# Patient Record
Sex: Female | Born: 1955
Health system: Southern US, Community
[De-identification: ages and names within clinical notes are randomized; demographics above are authoritative.]

## PROBLEM LIST (undated history)

## (undated) DIAGNOSIS — I10 Essential (primary) hypertension: Secondary | ICD-10-CM

## (undated) HISTORY — PX: TUBAL LIGATION: SHX77

## (undated) HISTORY — DX: Essential (primary) hypertension: I10

---

## 1999-04-29 ENCOUNTER — Other Ambulatory Visit: Admission: RE | Admit: 1999-04-29 | Discharge: 1999-04-29 | Payer: Self-pay | Admitting: *Deleted

## 2000-06-09 ENCOUNTER — Other Ambulatory Visit: Admission: RE | Admit: 2000-06-09 | Discharge: 2000-06-09 | Payer: Self-pay | Admitting: Obstetrics and Gynecology

## 2000-10-01 ENCOUNTER — Encounter: Payer: Self-pay | Admitting: Cardiology

## 2000-10-01 ENCOUNTER — Encounter: Admission: RE | Admit: 2000-10-01 | Discharge: 2000-10-01 | Payer: Self-pay | Admitting: Cardiology

## 2001-06-17 ENCOUNTER — Other Ambulatory Visit: Admission: RE | Admit: 2001-06-17 | Discharge: 2001-06-17 | Payer: Self-pay | Admitting: Obstetrics and Gynecology

## 2003-03-06 ENCOUNTER — Other Ambulatory Visit: Admission: RE | Admit: 2003-03-06 | Discharge: 2003-03-06 | Payer: Self-pay | Admitting: Obstetrics & Gynecology

## 2003-07-03 ENCOUNTER — Encounter: Payer: Self-pay | Admitting: Cardiology

## 2003-07-03 ENCOUNTER — Encounter: Admission: RE | Admit: 2003-07-03 | Discharge: 2003-07-03 | Payer: Self-pay | Admitting: Cardiology

## 2004-08-25 ENCOUNTER — Ambulatory Visit (HOSPITAL_COMMUNITY): Admission: RE | Admit: 2004-08-25 | Discharge: 2004-08-25 | Payer: Self-pay | Admitting: Cardiology

## 2005-10-15 ENCOUNTER — Encounter: Admission: RE | Admit: 2005-10-15 | Discharge: 2005-10-15 | Payer: Self-pay | Admitting: Cardiology

## 2008-02-27 ENCOUNTER — Emergency Department (HOSPITAL_COMMUNITY): Admission: EM | Admit: 2008-02-27 | Discharge: 2008-02-27 | Payer: Self-pay | Admitting: Emergency Medicine

## 2009-01-08 ENCOUNTER — Ambulatory Visit: Payer: Self-pay | Admitting: Gastroenterology

## 2009-09-18 ENCOUNTER — Emergency Department (HOSPITAL_COMMUNITY): Admission: EM | Admit: 2009-09-18 | Discharge: 2009-09-18 | Payer: Self-pay | Admitting: Family Medicine

## 2010-11-03 ENCOUNTER — Emergency Department (HOSPITAL_COMMUNITY): Admission: EM | Admit: 2010-11-03 | Discharge: 2010-11-03 | Payer: Self-pay | Admitting: Emergency Medicine

## 2012-05-11 ENCOUNTER — Encounter: Payer: Managed Care, Other (non HMO) | Attending: Family Medicine | Admitting: *Deleted

## 2012-05-11 ENCOUNTER — Encounter: Payer: Self-pay | Admitting: *Deleted

## 2012-05-11 DIAGNOSIS — E669 Obesity, unspecified: Secondary | ICD-10-CM | POA: Insufficient documentation

## 2012-05-11 DIAGNOSIS — Z713 Dietary counseling and surveillance: Secondary | ICD-10-CM | POA: Insufficient documentation

## 2012-05-11 DIAGNOSIS — I10 Essential (primary) hypertension: Secondary | ICD-10-CM | POA: Insufficient documentation

## 2012-05-11 NOTE — Progress Notes (Signed)
  Medical Nutrition Therapy:  Appt start time: 0845 end time:  0945.  Assessment:  Primary concerns today: patient here for assistance with weight loss and HTN. She states she is having trouble with menopause with breaking out in a sweat every afternoon and during the night. She states she has gained 30 pounds in the past 2 years. Recently she has lost 5 pounds in past 2 months after cutting out snacks and sweets. She is fairly active at work as she walks on campus daily for a mile or more. 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 She lives alone, enjoys quilting and prepares most of her own meals   MEDICATIONS: see list   DIETARY INTAKE:  Usual eating pattern includes 3 meals and 0 snacks per day.  Everyday foods include good variety of all food groups except dairy products.  Avoided foods include sodas now, ice cream due to lactose intolerance .    24-hr recall:  B ( AM): coffee and toast with oleo x 4 days a week, OR skip  Snk ( AM): none  L ( PM): cafe at work- salad and sandwich, crystal light Snk ( PM): none D ( PM): cook low fat meals, left overs, water Snk ( PM): none Beverages: coffee, flavored water, OJ occasionally  Usual physical activity: walks at work on campus,  Estimated energy needs: 1200 calories 135 g carbohydrates 90 g protein 33 g fat  Progress Towards Goal(s):  In progress.   Nutritional Diagnosis:  NI-1.5 Excessive energy intake As related to current activity level.  As evidenced by BMI of 31.1 .    Intervention:  Nutrition counseling provided for weight loss and HTN. Discussed Carb Counting as method for managing calorie intake and Sodium restrictions for HTN. Also recommended increased activity level to assist with both weight loss, HTN and symptoms of menopause Plan: Aim for 2-3 Carb Choices per meal (30-45 grams) Read food labels for total carb and sodium content of foods Continue with walking  at work Consider other forms of exercise ( armchair, pool) Consider a planned time that can be consistent on a daily basis  Handouts given during visit include: Carb Counting and Food Label handouts Meal Plan Card  Arm Chair Exercise book suggestion list  Low Sodium handout  Monitoring/Evaluation:  Dietary intake, exercise, reading food labels, and body weight prn.

## 2012-05-11 NOTE — Patient Instructions (Addendum)
Plan: Aim for 2-3 Carb Choices per meal (30-45 grams) Read food labels for total carb and sodium content of foods Continue with walking at work Consider other forms of exercise ( armchair, pool) Consider a planned time that can be consistent on a daily basis Reference for calories and carbs is Calorie Brooke Dare (book or APP)

## 2014-04-26 ENCOUNTER — Ambulatory Visit: Payer: BC Managed Care – PPO

## 2014-04-26 ENCOUNTER — Ambulatory Visit (INDEPENDENT_AMBULATORY_CARE_PROVIDER_SITE_OTHER): Payer: BC Managed Care – PPO | Admitting: Emergency Medicine

## 2014-04-26 VITALS — BP 110/64 | HR 65 | Temp 98.4°F | Resp 16 | Ht 67.0 in | Wt 196.0 lb

## 2014-04-26 DIAGNOSIS — M25569 Pain in unspecified knee: Secondary | ICD-10-CM

## 2014-04-26 MED ORDER — ACETAMINOPHEN-CODEINE #3 300-30 MG PO TABS: 1.0000 | ORAL_TABLET | ORAL | Status: DC | PRN

## 2014-04-26 NOTE — Patient Instructions (Signed)
Knee Pain Knee pain can be a result of an injury or other medical conditions. Treatment will depend on the cause of your pain. HOME CARE  Only take medicine as told by your doctor.  Keep a healthy weight. Being overweight can make the knee hurt more.  Stretch before exercising or playing sports.  If there is constant knee pain, change the way you exercise. Ask your doctor for advice.  Make sure shoes fit well. Choose the right shoe for the sport or activity.  Protect your knees. Wear kneepads if needed.  Rest when you are tired. GET HELP RIGHT AWAY IF:   Your knee pain does not stop.  Your knee pain does not get better.  Your knee joint feels hot to the touch.  You have a fever. MAKE SURE YOU:   Understand these instructions.  Will watch this condition.  Will get help right away if you are not doing well or get worse. Document Released: 02/12/2009 Document Revised: 02/08/2012 Document Reviewed: 02/12/2009 Exit[employer] Patient Information 2014 [employer].  

## 2014-04-26 NOTE — Progress Notes (Signed)
[facility] [address] [place] [address] [phone]  Date:  04/26/2014   Name:  Erica Trujillo   DOB:  1956/03/01   MRN:  [id number]  PCP:  Evlyn Courier, MD    Chief Complaint: Fall   History of Present Illness:  Erica Trujillo is a 58 y.o. very pleasant female patient who presents with the following:  [place] Sunday while chasing a puppy.  She tripped and fell, landing on her face and right knee.  Since she has some pain in the knee but marked ecchymosis in thigh to the groin and some in the posterior calf.  Minimal pain with ambulation but no pain in hip or pelvis.  Denies other complaint of injury.  Taking 4 alleve BID with no relief.    There are no active problems to display for this patient.   Past Medical History  Diagnosis Date  . Hypertension     No past surgical history on file.  History  Substance Use Topics  . Smoking status: Current Every Day Smoker -- 0.20 packs/day for 18 years  . Smokeless tobacco: Never Used  . Alcohol Use: Yes     Comment: 1 glass wine at night    No family history on file.  Allergies  Allergen Reactions  . Penicillins     Medication list has been reviewed and updated.  Current Outpatient Prescriptions on File Prior to Visit  Medication Sig Dispense Refill  . aspirin 81 MG tablet Take 81 mg by mouth daily.      . nebivolol (BYSTOLIC) 10 MG tablet Take 40 mg by mouth daily.      . Olmesartan-Amlodipine-HCTZ (TRIBENZOR) 40-10-25 MG TABS Take by mouth.       No current facility-administered medications on file prior to visit.    Review of Systems:  As per HPI, otherwise negative.    Physical Examination: Filed Vitals:   04/26/14 1823  BP: 110/64  Pulse: 65  Temp: 98.4 F (36.9 C)  Resp: 16   Filed Vitals:   04/26/14 1823  Height: 5\' 7"  (1.702 m)  Weight: 196 lb (88.905 kg)   Body mass index is 30.69 kg/(m^2). Ideal Body Weight: Weight in (lb) to have BMI = 25: 159.3   GEN:  WDWN, NAD, Non-toxic, Alert & Oriented x 3 HEENT: Atraumatic, Normocephalic.  Ears and Nose: No external deformity. EXTR: No clubbing/cyanosis/edema NEURO: Normal gait.  PSYCH: Normally interactive. Conversant. Not depressed or anxious appearing.  Calm demeanor.  Right lower extremity:  Marked ecchymosis thigh and posterior knee and calf.  Neurovascular intact.  Assessment and Plan: Muscle strain thigh Knee pain.   Signed,  Phillips Odor, MD   [facility] reading (PRIMARY) by  Dr. Dareen Piano.  Negative knee.

## 2014-05-11 ENCOUNTER — Other Ambulatory Visit: Payer: Self-pay | Admitting: Family Medicine

## 2014-05-11 DIAGNOSIS — M79604 Pain in right leg: Secondary | ICD-10-CM

## 2014-05-14 ENCOUNTER — Ambulatory Visit
Admission: RE | Admit: 2014-05-14 | Discharge: 2014-05-14 | Disposition: A | Discharge status: Home / Self Care | Payer: BC Managed Care – PPO | Source: Ambulatory Visit | Attending: Family Medicine | Admitting: Family Medicine

## 2014-05-14 DIAGNOSIS — M79604 Pain in right leg: Secondary | ICD-10-CM

## 2014-12-09 ENCOUNTER — Ambulatory Visit (INDEPENDENT_AMBULATORY_CARE_PROVIDER_SITE_OTHER): Payer: BLUE CROSS/BLUE SHIELD | Admitting: Emergency Medicine

## 2014-12-09 VITALS — BP 128/74 | HR 59 | Temp 98.3°F | Resp 16 | Ht 67.0 in | Wt 195.8 lb

## 2014-12-09 DIAGNOSIS — H6123 Impacted cerumen, bilateral: Secondary | ICD-10-CM

## 2014-12-09 NOTE — Progress Notes (Signed)
Subjective:  This chart was scribed for [name], MD by Haywood PaoNadim Abu Hashem, ED Scribe at [facility].The patient was seen in exam room 01 and the patient's care was started at 11:09 AM.   Patient ID: Erica BernhardtAndrena L Trujillo, female    DOB: 1956-11-28, 59 y.o.   MRN: [id number] Chief Complaint  Patient presents with  . Tinnitus    since weds.; feels clogged    HPI HPI Comments: Erica Trujillo is a 59 y.o. female who [facility] complaining of ringing in her left ear, onset 4 days ago. Pt has hearing loss as associated symptoms.. She describes it as muffled and "like she is in a plane". Pt has tried ear drops for relief. She denies ear pain, right ear hearing loss, ear discharge and fever. Pt has not recently traveled outside of the country.   There are no active problems to display for this patient.  Past Medical History  Diagnosis Date  . Hypertension    History reviewed. No pertinent past surgical history. Allergies  Allergen Reactions  . Penicillins    Prior to Admission medications   Medication Sig Start Date End Date Taking? Authorizing Provider  aspirin 81 MG tablet Take 81 mg by mouth daily.   Yes Historical Provider, MD  nebivolol (BYSTOLIC) 10 MG tablet Take 40 mg by mouth daily.   Yes Historical Provider, MD  Olmesartan-Amlodipine-HCTZ (TRIBENZOR) 40-10-25 MG TABS Take by mouth.   Yes Historical Provider, MD  acetaminophen-codeine (TYLENOL #3) 300-30 MG per tablet Take 1-2 tablets by mouth every 4 (four) hours as needed. Patient not taking: Reported on 12/09/2014 04/26/14   Carmelina DaneJeffery S Anderson, MD   History   Social History  . Marital Status: Widowed    Spouse Name: N/A    Number of Children: N/A  . Years of Education: N/A   Occupational History  . Not on file.   Social History Main Topics  . Smoking status: Current Every Day Smoker -- 0.20 packs/day for 18 years  . Smokeless tobacco: Never Used  . Alcohol Use: Yes     Comment: 1 glass wine  at night  . Drug Use: No  . Sexual Activity: Not on file   Other Topics Concern  . Not on file   Social History Narrative   Review of Systems  Constitutional: Negative for fever.  HENT: Positive for hearing loss. Negative for ear discharge and ear pain.        Objective:  BP 128/74 mmHg  Pulse 59  Temp(Src) 98.3 F (36.8 C) (Oral)  Resp 16  Ht 5\' 7"  (1.702 m)  Wt 195 lb 12.8 oz (88.814 kg)  BMI 30.66 kg/m2  SpO2 98%  Physical Exam  Constitutional: She is oriented to person, place, and time. She appears well-developed and well-nourished. No distress.  HENT:  Head: Normocephalic and atraumatic.  Wax impaction of both auditory canals. Throat is normal  Eyes: EOM are normal.  Neck: Normal range of motion.  Cardiovascular: Normal rate.   Pulmonary/Chest: Effort normal.  Neurological: She is alert and oriented to person, place, and time. No cranial nerve deficit. She exhibits normal muscle tone. Coordination normal.  Skin: Skin is warm and dry.  Psychiatric: She has a normal mood and affect. Her behavior is normal.  Nursing note and vitals reviewed.       Assessment & Plan:  Patient responded well to irrigation. Recheck when necessary.I personally performed the services described in this documentation, which  was scribed in my presence. The recorded information has been reviewed and is accurate.

## 2017-01-05 ENCOUNTER — Ambulatory Visit (INDEPENDENT_AMBULATORY_CARE_PROVIDER_SITE_OTHER): Payer: BLUE CROSS/BLUE SHIELD | Admitting: Family Medicine

## 2017-01-05 VITALS — BP 122/72 | HR 65 | Temp 98.5°F | Resp 17 | Ht 67.5 in | Wt 195.0 lb

## 2017-01-05 DIAGNOSIS — M5431 Sciatica, right side: Secondary | ICD-10-CM

## 2017-01-05 DIAGNOSIS — M5410 Radiculopathy, site unspecified: Secondary | ICD-10-CM

## 2017-01-05 MED ORDER — TRAMADOL HCL 50 MG PO TABS: 50.0000 mg | ORAL_TABLET | Freq: Four times a day (QID) | ORAL | 0 refills | Status: DC | PRN

## 2017-01-05 MED ORDER — CYCLOBENZAPRINE HCL 5 MG PO TABS: ORAL_TABLET | ORAL | 0 refills | Status: DC

## 2017-01-05 MED ORDER — MELOXICAM 7.5 MG PO TABS: 7.5000 mg | ORAL_TABLET | Freq: Every day | ORAL | 0 refills | Status: DC | PRN

## 2017-01-05 NOTE — Progress Notes (Signed)
By signing my name below, I, Mesha Guinyard, attest that this documentation has been prepared under the direction and in the presence of Meredith Staggers, MD.  Electronically Signed: Arvilla Market, Medical Scribe. 01/05/17. 9:15 AM.  Subjective:    Patient ID: Erica Trujillo, female    DOB: [birth year], 61 y.o.   MRN: [id number]  HPI Chief Complaint  Patient presents with  . Leg Pain    right side     HPI Comments: Erica Trujillo is a 61 y.o. female who [facility] complaining of radiating right leg pain onset 3 days ago. Prior records reviewed. She was seen May 2015 with right knee pain after a fall, but no pain in hip or pelvis at that time. Suspected thigh strain  Her sxs started in her right buttock and radiated to the side/back of her right leg. Her leg pain worsened yesterday and it feels similar to the sciatic pain 3 years ago that last a week and was relieved with pain medication. Pt took ibuprofen 800 mg every 6 hours without relief of her sxs. Pain occurs with ambulation. Denies recent injuries to her leg and PMHx of seizures. Denies rash, burning sensation, weakness, saddle anesthesia, and bowel/bladder incontinence.  There are no active problems to display for this patient.  Past Medical History:  Diagnosis Date  . Hypertension    No past surgical history on file. Allergies  Allergen Reactions  . Penicillins    Prior to Admission medications   Medication Sig Start Date End Date Taking? Authorizing Provider  acetaminophen-codeine (TYLENOL #3) 300-30 MG per tablet Take 1-2 tablets by mouth every 4 (four) hours as needed. Patient not taking: Reported on 12/09/2014 04/26/14   Carmelina Dane, MD  aspirin 81 MG tablet Take 81 mg by mouth daily.    Historical Provider, MD  nebivolol (BYSTOLIC) 10 MG tablet Take 40 mg by mouth daily.    Historical Provider, MD  Olmesartan-Amlodipine-HCTZ (TRIBENZOR) 40-10-25 MG TABS Take by mouth.     Historical Provider, MD   Social History   Social History  . Marital status: Widowed    Spouse name: N/A  . Number of children: N/A  . Years of education: N/A   Occupational History  . Not on file.   Social History Main Topics  . Smoking status: Current Every Day Smoker    Packs/day: 0.20    Years: 18.00  . Smokeless tobacco: Never Used  . Alcohol use Yes     Comment: 1 glass wine at night  . Drug use: No  . Sexual activity: Not on file   Other Topics Concern  . Not on file   Social History Narrative  . No narrative on file   Review of Systems  Musculoskeletal: Positive for arthralgias and myalgias.  Skin: Negative for rash.  Neurological: Negative for weakness and numbness.   Objective:  Physical Exam  Constitutional: She appears well-developed and well-nourished. No distress.  HENT:  Head: Normocephalic and atraumatic.  Eyes: Conjunctivae are normal.  Neck: Neck supple.  Cardiovascular: Normal rate.   Pulmonary/Chest: Effort normal.  Musculoskeletal:  Flexion to 90 degrees Guarded with right lateral flexion Equal rotation, slight discomfort with extension  Neurological: She is alert. She displays no Babinski's sign on the right side. She displays no Babinski's sign on the left side.  Reflex Scores:      Patellar reflexes are 2+ on the right side and 2+ on the left side.  Achilles reflexes are 2+ on the right side and 2+ on the left side. Negative seated straight leg raise  Skin: Skin is warm and dry. No rash noted.  Skin non tender No rash  Psychiatric: She has a normal mood and affect. Her behavior is normal.  Nursing note and vitals reviewed.  BP 122/72 (BP Location: Right Arm, Patient Position: Sitting, Cuff Size: Normal)   Pulse 65   Temp 98.5 F (36.9 C) (Oral)   Resp 17   Ht 5' 7.5" (1.715 m)   Wt 195 lb (88.5 kg)   SpO2 97%   BMI 30.09 kg/m  Assessment & Plan:    Erica Trujillo is a 61 y.o. female Right sciatic nerve pain - Plan:  meloxicam (MOBIC) 7.5 MG tablet, cyclobenzaprine (FLEXERIL) 5 MG tablet, traMADol (ULTRAM) 50 MG tablet  Radicular leg pain - Plan: meloxicam (MOBIC) 7.5 MG tablet, cyclobenzaprine (FLEXERIL) 5 MG tablet, traMADol (ULTRAM) 50 MG tablet  Suspected sciatica, similar symptoms as in past with her sciatica. No concerning findings on exam, no red flags on history. Imaging deferred at this time given similar symptoms as in past.  -Start meloxicam, Flexeril as needed. Side effects discussed. Tramadol provided if needed for breakthrough pain, cautioned on combination with other sedating medications.  -Recheck in the next 1 week if not improved for possible imaging and consider prednisone at that time. RTC precautions sooner if worse  Meds ordered this encounter  Medications  . meloxicam (MOBIC) 7.5 MG tablet    Sig: Take 1-2 tablets (7.5-15 mg total) by mouth daily as needed for pain.    Dispense:  30 tablet    Refill:  0  . cyclobenzaprine (FLEXERIL) 5 MG tablet    Sig: 1 pill by mouth up to every 8 hours as needed. Start with one pill by mouth each bedtime as needed due to sedation    Dispense:  15 tablet    Refill:  0  . traMADol (ULTRAM) 50 MG tablet    Sig: Take 1 tablet (50 mg total) by mouth every 6 (six) hours as needed.    Dispense:  20 tablet    Refill:  0   Patient Instructions    You likely have a return of sciatica from the low back, which can lead to some muscle spasm as well. Try the mobic each morning (do not combine with other over the counter pain relievers like ibuprofen), flexeril at night if needed.  Heat or ice to area as needed and tramadol if needed for pain that is not improved with other medication.   See other information below, but follow-up if you are not improving in the next week as x-ray will likely be needed at that time and possibly prednisone.   Sciatica Sciatica is pain, numbness, weakness, or tingling along the path of the sciatic nerve. The sciatic nerve  starts in the lower back and runs down the back of each leg. The nerve controls the muscles in the lower leg and in the back of the knee. It also provides feeling (sensation) to the back of the thigh, the lower leg, and the sole of the foot. Sciatica is a symptom of another medical condition that pinches or puts pressure on the sciatic nerve. Generally, sciatica only affects one side of the body. Sciatica usually goes away on its own or with treatment. In some cases, sciatica may keep coming back (recur). What are the causes? This condition is caused by pressure on the sciatic  nerve, or pinching of the sciatic nerve. This may be the result of:  A disk in between the bones of the spine (vertebrae) bulging out too far (herniated disk).  Age-related changes in the spinal disks (degenerative disk disease).  A pain disorder that affects a muscle in the buttock (piriformis syndrome).  Extra bone growth (bone spur) near the sciatic nerve.  An injury or break (fracture) of the pelvis.  Pregnancy.  Tumor (rare). What increases the risk? The following factors may make you more likely to develop this condition:  Playing sports that place pressure or stress on the spine, such as football or weight lifting.  Having poor strength and flexibility.  A history of back injury.  A history of back surgery.  Sitting for long periods of time.  Doing activities that involve repetitive bending or lifting.  Obesity. What are the signs or symptoms? Symptoms can vary from mild to very severe, and they may include:  Any of these problems in the lower back, leg, hip, or buttock:  Mild tingling or dull aches.  Burning sensations.  Sharp pains.  Numbness in the back of the calf or the sole of the foot.  Leg weakness.  Severe back pain that makes movement difficult. These symptoms may get worse when you cough, sneeze, or laugh, or when you sit or stand for long periods of time. Being overweight may  also make symptoms worse. In some cases, symptoms may recur over time. How is this diagnosed? This condition may be diagnosed based on:  Your symptoms.  A physical exam. Your health care provider may ask you to do certain movements to check whether those movements trigger your symptoms.  You may have tests, including:  Blood tests.  X-rays.  MRI.  CT scan. How is this treated? In many cases, this condition improves on its own, without any treatment. However, treatment may include:  Reducing or modifying physical activity during periods of pain.  Exercising and stretching to strengthen your abdomen and improve the flexibility of your spine.  Icing and applying heat to the affected area.  Medicines that help:  To relieve pain and swelling.  To relax your muscles.  Injections of medicines that help to relieve pain, irritation, and inflammation around the sciatic nerve (steroids).  Surgery. Follow these instructions at home: Medicines  Take over-the-counter and prescription medicines only as told by your health care provider.  Do not drive or operate heavy machinery while taking prescription pain medicine. Managing pain  If directed, apply ice to the affected area.  Put ice in a plastic bag.  Place a towel between your skin and the bag.  Leave the ice on for 20 minutes, 2-3 times a day.  After icing, apply heat to the affected area before you exercise or as often as told by your health care provider. Use the heat source that your health care provider recommends, such as a moist heat pack or a heating pad.  Place a towel between your skin and the heat source.  Leave the heat on for 20-30 minutes.  Remove the heat if your skin turns bright red. This is especially important if you are unable to feel pain, heat, or cold. You may have a greater risk of getting burned. Activity  Return to your normal activities as told by your health care provider. Ask your health  care provider what activities are safe for you.  Avoid activities that make your symptoms worse.  Take brief periods of rest  throughout the day. Resting in a lying or standing position is usually better than sitting to rest.  When you rest for longer periods, mix in some mild activity or stretching between periods of rest. This will help to prevent stiffness and pain.  Avoid sitting for long periods of time without moving. Get up and move around at least one time each hour.  Exercise and stretch regularly, as told by your health care provider.  Do not lift anything that is heavier than 10 lb (4.5 kg) while you have symptoms of sciatica. When you do not have symptoms, you should still avoid heavy lifting, especially repetitive heavy lifting.  When you lift objects, always use proper lifting technique, which includes:  Bending your knees.  Keeping the load close to your body.  Avoiding twisting. General instructions  Use good posture.  Avoid leaning forward while sitting.  Avoid hunching over while standing.  Maintain a healthy weight. Excess weight puts extra stress on your back and makes it difficult to maintain good posture.  Wear supportive, comfortable shoes. Avoid wearing high heels.  Avoid sleeping on a mattress that is too soft or too hard. A mattress that is firm enough to support your back when you sleep may help to reduce your pain.  Keep all follow-up visits as told by your health care provider. This is important. Contact a health care provider if:  You have pain that wakes you up when you are sleeping.  You have pain that gets worse when you lie down.  Your pain is worse than you have experienced in the past.  Your pain lasts longer than 4 weeks.  You experience unexplained weight loss. Get help right away if:  You lose control of your bowel or bladder (incontinence).  You have:  Weakness in your lower back, pelvis, buttocks, or legs that gets  worse.  Redness or swelling of your back.  A burning sensation when you urinate. This information is not intended to replace advice given to you by your health care provider. Make sure you discuss any questions you have with your health care provider. Document Released: 11/10/2001 Document Revised: 04/21/2016 Document Reviewed: 07/26/2015 Elsevier Interactive Patient Education  2017 [employer].    IF you received an x-ray today, you will receive an invoice from [facility]. Please contact [facility] at [phone] with questions or concerns regarding your invoice.   IF you received labwork today, you will receive an invoice from [facility]. Please contact [facility] at [phone] with questions or concerns regarding your invoice.   Our billing staff will not be able to assist you with questions regarding bills from these companies.  You will be contacted with the lab results as soon as they are available. The fastest way to get your results is to activate your My Chart account. Instructions are located on the last page of this paperwork. If you have not heard from [place] regarding the results in 2 weeks, please contact this office.       I personally performed the services described in this documentation, which was scribed in my presence. The recorded information has been reviewed and considered for accuracy and completeness, addended by me as needed, and agree with information above.  Signed,   Meredith Staggers, MD Primary Care at [facility].  01/05/17 9:30 AM

## 2017-01-05 NOTE — Patient Instructions (Addendum)
You likely have a return of sciatica from the low back, which can lead to some muscle spasm as well. Try the mobic each morning (do not combine with other over the counter pain relievers like ibuprofen), flexeril at night if needed.  Heat or ice to area as needed and tramadol if needed for pain that is not improved with other medication.   See other information below, but follow-up if you are not improving in the next week as x-ray will likely be needed at that time and possibly prednisone.   Sciatica Sciatica is pain, numbness, weakness, or tingling along the path of the sciatic nerve. The sciatic nerve starts in the lower back and runs down the back of each leg. The nerve controls the muscles in the lower leg and in the back of the knee. It also provides feeling (sensation) to the back of the thigh, the lower leg, and the sole of the foot. Sciatica is a symptom of another medical condition that pinches or puts pressure on the sciatic nerve. Generally, sciatica only affects one side of the body. Sciatica usually goes away on its own or with treatment. In some cases, sciatica may keep coming back (recur). What are the causes? This condition is caused by pressure on the sciatic nerve, or pinching of the sciatic nerve. This may be the result of:  A disk in between the bones of the spine (vertebrae) bulging out too far (herniated disk).  Age-related changes in the spinal disks (degenerative disk disease).  A pain disorder that affects a muscle in the buttock (piriformis syndrome).  Extra bone growth (bone spur) near the sciatic nerve.  An injury or break (fracture) of the pelvis.  Pregnancy.  Tumor (rare). What increases the risk? The following factors may make you more likely to develop this condition:  Playing sports that place pressure or stress on the spine, such as football or weight lifting.  Having poor strength and flexibility.  A history of back injury.  A history of back  surgery.  Sitting for long periods of time.  Doing activities that involve repetitive bending or lifting.  Obesity. What are the signs or symptoms? Symptoms can vary from mild to very severe, and they may include:  Any of these problems in the lower back, leg, hip, or buttock:  Mild tingling or dull aches.  Burning sensations.  Sharp pains.  Numbness in the back of the calf or the sole of the foot.  Leg weakness.  Severe back pain that makes movement difficult. These symptoms may get worse when you cough, sneeze, or laugh, or when you sit or stand for long periods of time. Being overweight may also make symptoms worse. In some cases, symptoms may recur over time. How is this diagnosed? This condition may be diagnosed based on:  Your symptoms.  A physical exam. Your health care provider may ask you to do certain movements to check whether those movements trigger your symptoms.  You may have tests, including:  Blood tests.  X-rays.  MRI.  CT scan. How is this treated? In many cases, this condition improves on its own, without any treatment. However, treatment may include:  Reducing or modifying physical activity during periods of pain.  Exercising and stretching to strengthen your abdomen and improve the flexibility of your spine.  Icing and applying heat to the affected area.  Medicines that help:  To relieve pain and swelling.  To relax your muscles.  Injections of medicines that help to  relieve pain, irritation, and inflammation around the sciatic nerve (steroids).  Surgery. Follow these instructions at home: Medicines  Take over-the-counter and prescription medicines only as told by your health care provider.  Do not drive or operate heavy machinery while taking prescription pain medicine. Managing pain  If directed, apply ice to the affected area.  Put ice in a plastic bag.  Place a towel between your skin and the bag.  Leave the ice on for  20 minutes, 2-3 times a day.  After icing, apply heat to the affected area before you exercise or as often as told by your health care provider. Use the heat source that your health care provider recommends, such as a moist heat pack or a heating pad.  Place a towel between your skin and the heat source.  Leave the heat on for 20-30 minutes.  Remove the heat if your skin turns bright red. This is especially important if you are unable to feel pain, heat, or cold. You may have a greater risk of getting burned. Activity  Return to your normal activities as told by your health care provider. Ask your health care provider what activities are safe for you.  Avoid activities that make your symptoms worse.  Take brief periods of rest throughout the day. Resting in a lying or standing position is usually better than sitting to rest.  When you rest for longer periods, mix in some mild activity or stretching between periods of rest. This will help to prevent stiffness and pain.  Avoid sitting for long periods of time without moving. Get up and move around at least one time each hour.  Exercise and stretch regularly, as told by your health care provider.  Do not lift anything that is heavier than 10 lb (4.5 kg) while you have symptoms of sciatica. When you do not have symptoms, you should still avoid heavy lifting, especially repetitive heavy lifting.  When you lift objects, always use proper lifting technique, which includes:  Bending your knees.  Keeping the load close to your body.  Avoiding twisting. General instructions  Use good posture.  Avoid leaning forward while sitting.  Avoid hunching over while standing.  Maintain a healthy weight. Excess weight puts extra stress on your back and makes it difficult to maintain good posture.  Wear supportive, comfortable shoes. Avoid wearing high heels.  Avoid sleeping on a mattress that is too soft or too hard. A mattress that is firm  enough to support your back when you sleep may help to reduce your pain.  Keep all follow-up visits as told by your health care provider. This is important. Contact a health care provider if:  You have pain that wakes you up when you are sleeping.  You have pain that gets worse when you lie down.  Your pain is worse than you have experienced in the past.  Your pain lasts longer than 4 weeks.  You experience unexplained weight loss. Get help right away if:  You lose control of your bowel or bladder (incontinence).  You have:  Weakness in your lower back, pelvis, buttocks, or legs that gets worse.  Redness or swelling of your back.  A burning sensation when you urinate. This information is not intended to replace advice given to you by your health care provider. Make sure you discuss any questions you have with your health care provider. Document Released: 11/10/2001 Document Revised: 04/21/2016 Document Reviewed: 07/26/2015 Elsevier Interactive Patient Education  2017 [employer].  IF you received an x-ray today, you will receive an invoice from [facility]. Please contact [facility] at [phone] with questions or concerns regarding your invoice.   IF you received labwork today, you will receive an invoice from [facility]. Please contact [facility] at [phone] with questions or concerns regarding your invoice.   Our billing staff will not be able to assist you with questions regarding bills from these companies.  You will be contacted with the lab results as soon as they are available. The fastest way to get your results is to activate your My Chart account. Instructions are located on the last page of this paperwork. If you have not heard from us regarding the results in 2 weeks, please contact this office.      

## 2018-08-02 DIAGNOSIS — M19049 Primary osteoarthritis, unspecified hand: Secondary | ICD-10-CM | POA: Diagnosis not present

## 2018-08-02 DIAGNOSIS — I10 Essential (primary) hypertension: Secondary | ICD-10-CM | POA: Diagnosis not present

## 2018-08-02 DIAGNOSIS — Z0000 Encounter for general adult medical examination without abnormal findings: Secondary | ICD-10-CM | POA: Diagnosis not present

## 2018-08-02 DIAGNOSIS — E785 Hyperlipidemia, unspecified: Secondary | ICD-10-CM | POA: Diagnosis not present

## 2018-08-02 DIAGNOSIS — M6530 Trigger finger, unspecified finger: Secondary | ICD-10-CM | POA: Diagnosis not present

## 2018-08-24 ENCOUNTER — Ambulatory Visit: Payer: Self-pay | Admitting: Family

## 2018-08-24 DIAGNOSIS — Z23 Encounter for immunization: Secondary | ICD-10-CM

## 2018-08-30 ENCOUNTER — Ambulatory Visit: Payer: 59 | Admitting: Obstetrics & Gynecology

## 2018-08-30 ENCOUNTER — Encounter: Payer: Self-pay | Admitting: Obstetrics & Gynecology

## 2018-08-30 VITALS — BP 140/90 | Ht 65.25 in | Wt 189.0 lb

## 2018-08-30 DIAGNOSIS — Z780 Asymptomatic menopausal state: Secondary | ICD-10-CM | POA: Diagnosis not present

## 2018-08-30 DIAGNOSIS — Z01419 Encounter for gynecological examination (general) (routine) without abnormal findings: Secondary | ICD-10-CM | POA: Diagnosis not present

## 2018-08-30 DIAGNOSIS — Z13820 Encounter for screening for osteoporosis: Secondary | ICD-10-CM | POA: Diagnosis not present

## 2018-08-30 NOTE — Patient Instructions (Signed)
1. Well female exam with routine gynecological exam Normal gynecologic exam.  Pap negative in January 2018.  No indication to repeat today.  Breast exam normal.  Screening mammogram benign in January 2018.  Patient will schedule at [facility] or the breast center.  Body mass index 31.21.  Recommend lower calorie/carb nutrition such as [employer] and increase physical activity with aerobic activities 5 times a week and weightlifting every 2 days.  Health labs with family physician.  2. Menopause present Well on no hormone replacement therapy.  No postmenopausal bleeding.  3. Screening for osteoporosis Will schedule bone density here now.  Recommend vitamin D supplements, calcium intake of 1.5 g/day and regular weightbearing physical activity. - DG Bone Density; Future  Erica Trujillo, it was a pleasure seeing you today!

## 2018-08-30 NOTE — Progress Notes (Signed)
Erica Trujillo 1956/03/14 [id number]   History:    62 y.o. G3P1A2L1 Widowed.  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  No PMB.  No pelvic pain.  Abstinent.  Urine and bowel movements normal.  Breasts normal.  Body mass index 31.21.  Physically active, walking every day.  Health labs with family physician.  Colonoscopy in 2017 was normal per patient.  Past medical history,surgical history, family history and social history were all reviewed and documented in the EPIC chart.  Gynecologic History No LMP recorded. (Menstrual status: Perimenopausal). Contraception: post menopausal status/Abstinent Last Pap: 11/2016. Results were: Negative Last mammogram: 11/2016. Results were: Negative Bone Density: Never.  Will schedule here now Colonoscopy: 2017 normal  Obstetric History OB History  Gravida Para Term Preterm AB Living  3 1     2 1   SAB TAB Ectopic Multiple Live Births               # Outcome Date GA Lbr Len/2nd Weight Sex Delivery Anes PTL Lv  3 AB           2 AB           1 Para              ROS: A ROS was performed and pertinent positives and negatives are included in the history.  GENERAL: No fevers or chills. HEENT: No change in vision, no earache, sore throat or sinus congestion. NECK: No pain or stiffness. CARDIOVASCULAR: No chest pain or pressure. No palpitations. PULMONARY: No shortness of breath, cough or wheeze. GASTROINTESTINAL: No abdominal pain, nausea, vomiting or diarrhea, melena or bright red blood per rectum. GENITOURINARY: No urinary frequency, urgency, hesitancy or dysuria. MUSCULOSKELETAL: No joint or muscle pain, no back pain, no recent trauma. DERMATOLOGIC: No rash, no itching, no lesions. ENDOCRINE: No polyuria, polydipsia, no heat or cold intolerance. No recent change in weight. HEMATOLOGICAL: No anemia or easy bruising or  bleeding. NEUROLOGIC: No headache, seizures, numbness, tingling or weakness. PSYCHIATRIC: No depression, no loss of interest in normal activity or change in sleep pattern.     Exam:   BP 140/90   Ht 5' 5.25" (1.657 m)   Wt 189 lb (85.7 kg)   BMI 31.21 kg/m   Body mass index is 31.21 kg/m.  General appearance : Well developed well nourished female. No acute distress HEENT: Eyes: no retinal hemorrhage or exudates,  Neck supple, trachea midline, no carotid bruits, no thyroidmegaly Lungs: Clear to auscultation, no rhonchi or wheezes, or rib retractions  Heart: Regular rate and rhythm, no murmurs or gallops Breast:Examined in sitting and supine position were symmetrical in appearance, no palpable masses or tenderness,  no skin retraction, no nipple inversion, no nipple discharge, no skin discoloration, no axillary or supraclavicular lymphadenopathy Abdomen: no palpable masses or tenderness, no rebound or guarding Extremities: no edema or skin discoloration or tenderness  Pelvic: Vulva: Normal             Vagina: No gross lesions or discharge  Cervix: No gross lesions or discharge  Uterus  AV, normal size, shape and consistency, non-tender and mobile  Adnexa  Without masses or tenderness  Anus: Normal   Assessment/Plan:  62 y.o. female for annual exam   1. Well female exam with routine gynecological exam Normal gynecologic exam.  Pap negative in January 2018.  No indication to repeat today.  Breast exam normal.  Screening mammogram benign in January 2018.  Patient will schedule at [facility] or the breast center.  Body mass index 31.21.  Recommend lower calorie/carb nutrition such as [employer] and increase physical activity with aerobic activities 5 times a week and weightlifting every 2 days.  Health labs with family physician.  2. Menopause present Well on no hormone replacement therapy.  No postmenopausal bleeding.  3. Screening for osteoporosis Will schedule bone density here  now.  Recommend vitamin D supplements, calcium intake of 1.5 g/day and regular weightbearing physical activity. - DG Bone Density; Future  Genia Del MD, 8:53 AM 08/30/2018

## 2018-09-29 ENCOUNTER — Other Ambulatory Visit: Payer: Self-pay | Admitting: Obstetrics & Gynecology

## 2018-09-29 ENCOUNTER — Ambulatory Visit (INDEPENDENT_AMBULATORY_CARE_PROVIDER_SITE_OTHER): Payer: 59

## 2018-09-29 DIAGNOSIS — M8588 Other specified disorders of bone density and structure, other site: Secondary | ICD-10-CM

## 2018-09-29 DIAGNOSIS — M85851 Other specified disorders of bone density and structure, right thigh: Secondary | ICD-10-CM

## 2018-09-29 DIAGNOSIS — Z13820 Encounter for screening for osteoporosis: Secondary | ICD-10-CM | POA: Diagnosis not present

## 2018-10-03 ENCOUNTER — Telehealth: Payer: Self-pay | Admitting: *Deleted

## 2018-10-03 NOTE — Telephone Encounter (Signed)
-----   Message from Genia Del, MD sent at 10/02/2018  5:59 PM EST ----- Regarding: Bone Density results Bone density: Osteopenia T score of -1.4 at the right femoral neck recommend to continue with vitamin D supplements, calcium intake of 1.5 g/day and regular weightbearing physical activity.  Will repeat bone density in 2 years.

## 2018-10-03 NOTE — Telephone Encounter (Signed)
Patient informed. 

## 2018-10-24 ENCOUNTER — Encounter: Payer: BLUE CROSS/BLUE SHIELD | Admitting: Obstetrics & Gynecology

## 2019-01-02 DIAGNOSIS — I10 Essential (primary) hypertension: Secondary | ICD-10-CM | POA: Diagnosis not present

## 2019-01-02 DIAGNOSIS — M130 Polyarthritis, unspecified: Secondary | ICD-10-CM | POA: Diagnosis not present

## 2019-01-02 DIAGNOSIS — M150 Primary generalized (osteo)arthritis: Secondary | ICD-10-CM | POA: Diagnosis not present

## 2019-01-02 DIAGNOSIS — E785 Hyperlipidemia, unspecified: Secondary | ICD-10-CM | POA: Diagnosis not present

## 2019-03-13 DIAGNOSIS — E785 Hyperlipidemia, unspecified: Secondary | ICD-10-CM | POA: Diagnosis not present

## 2019-03-13 DIAGNOSIS — I10 Essential (primary) hypertension: Secondary | ICD-10-CM | POA: Diagnosis not present

## 2019-06-13 ENCOUNTER — Other Ambulatory Visit: Payer: Self-pay | Admitting: *Deleted

## 2019-06-13 DIAGNOSIS — Z20822 Contact with and (suspected) exposure to covid-19: Secondary | ICD-10-CM

## 2019-06-17 LAB — NOVEL CORONAVIRUS, NAA: SARS-CoV-2, NAA: NOT DETECTED

## 2019-09-01 ENCOUNTER — Encounter: Payer: Self-pay | Admitting: Obstetrics & Gynecology

## 2019-09-01 DIAGNOSIS — Z1231 Encounter for screening mammogram for malignant neoplasm of breast: Secondary | ICD-10-CM | POA: Diagnosis not present

## 2019-09-18 ENCOUNTER — Other Ambulatory Visit: Payer: Self-pay

## 2019-09-18 DIAGNOSIS — Z20822 Contact with and (suspected) exposure to covid-19: Secondary | ICD-10-CM

## 2019-09-19 ENCOUNTER — Ambulatory Visit (INDEPENDENT_AMBULATORY_CARE_PROVIDER_SITE_OTHER): Payer: Medicare Other | Admitting: Obstetrics & Gynecology

## 2019-09-19 ENCOUNTER — Encounter: Payer: Self-pay | Admitting: Obstetrics & Gynecology

## 2019-09-19 VITALS — BP 140/86 | Ht 66.0 in | Wt 185.0 lb

## 2019-09-19 DIAGNOSIS — M8588 Other specified disorders of bone density and structure, other site: Secondary | ICD-10-CM | POA: Diagnosis not present

## 2019-09-19 DIAGNOSIS — Z780 Asymptomatic menopausal state: Secondary | ICD-10-CM | POA: Diagnosis not present

## 2019-09-19 DIAGNOSIS — Z01419 Encounter for gynecological examination (general) (routine) without abnormal findings: Secondary | ICD-10-CM

## 2019-09-19 DIAGNOSIS — E663 Overweight: Secondary | ICD-10-CM | POA: Diagnosis not present

## 2019-09-19 DIAGNOSIS — M85851 Other specified disorders of bone density and structure, right thigh: Secondary | ICD-10-CM

## 2019-09-19 NOTE — Progress Notes (Signed)
Erica Trujillo 06/27/1956 [id number]   History:    63 y.o. G3P1A2L1 Widowed.  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  No PMB.  No pelvic pain.  Abstinent.  Urine and bowel movements normal.  Breasts normal.  Body mass index 29.86.  Physically active, walking every day.  Health labs with family physician.  Colonoscopy in 2017 was normal per patient.   Past medical history,surgical history, family history and social history were all reviewed and documented in the EPIC chart.  Gynecologic History Postmenopause Contraception: post menopausal status Last Pap: 11/2016. Results were: Negative Last mammogram: 08/2019. Results were: normal per patient Bone Density: 08/2018 Osteopenia Rt femoral neck Colonoscopy: 2017  Obstetric History OB History  Gravida Para Term Preterm AB Living  3 1     2 1   SAB TAB Ectopic Multiple Live Births               # Outcome Date GA Lbr Len/2nd Weight Sex Delivery Anes PTL Lv  3 AB           2 AB           1 Para              ROS: A ROS was performed and pertinent positives and negatives are included in the history.  GENERAL: No fevers or chills. HEENT: No change in vision, no earache, sore throat or sinus congestion. NECK: No pain or stiffness. CARDIOVASCULAR: No chest pain or pressure. No palpitations. PULMONARY: No shortness of breath, cough or wheeze. GASTROINTESTINAL: No abdominal pain, nausea, vomiting or diarrhea, melena or bright red blood per rectum. GENITOURINARY: No urinary frequency, urgency, hesitancy or dysuria. MUSCULOSKELETAL: No joint or muscle pain, no back pain, no recent trauma. DERMATOLOGIC: No rash, no itching, no lesions. ENDOCRINE: No polyuria, polydipsia, no heat or cold intolerance. No recent change in weight. HEMATOLOGICAL: No anemia or easy bruising or bleeding. NEUROLOGIC: No headache, seizures,  numbness, tingling or weakness. PSYCHIATRIC: No depression, no loss of interest in normal activity or change in sleep pattern.     Exam:   BP 140/86   Ht 5\' 6"  (1.676 m)   Wt 185 lb (83.9 kg)   BMI 29.86 kg/m   Body mass index is 29.86 kg/m.  General appearance : Well developed well nourished female. No acute distress HEENT: Eyes: no retinal hemorrhage or exudates,  Neck supple, trachea midline, no carotid bruits, no thyroidmegaly Lungs: Clear to auscultation, no rhonchi or wheezes, or rib retractions  Heart: Regular rate and rhythm, no murmurs or gallops Breast:Examined in sitting and supine position were symmetrical in appearance, no palpable masses or tenderness,  no skin retraction, no nipple inversion, no nipple discharge, no skin discoloration, no axillary or supraclavicular lymphadenopathy Abdomen: no palpable masses or tenderness, no rebound or guarding Extremities: no edema or skin discoloration or tenderness  Pelvic: Vulva: Normal             Vagina: No gross lesions or discharge  Cervix: No gross lesions or discharge  Uterus  AV, normal size, shape and consistency, non-tender and mobile  Adnexa  Without masses or tenderness  Anus: Normal   Assessment/Plan:  63 y.o. female for annual exam   1. Well female exam with routine gynecological exam Normal gynecologic exam in menopause.  Pap test in January 2018 was negative,  no indication to repeat this year.  Breast exam normal.  Last mammogram September 2020 was normal per patient.  Colonoscopy in 2017.  Health labs with family physician.  2. Postmenopause Well on no hormone replacement therapy.  No postmenopausal bleeding.  3. Osteopenia of neck of right femur Last bone density October 2019 showed osteopenia.  Vitamin D supplements, calcium intake of 1200 mg daily and regular weightbearing physical activities are recommended.  4. Overweight (BMI 25.0-29.9) Lower calorie/carb diet such as [employer].  Increase  physical activities with aerobic activities 5 times a week and weightlifting every 2 days.  Genia Del MD, 10:58 AM 09/19/2019

## 2019-09-19 NOTE — Patient Instructions (Signed)
1. Well female exam with routine gynecological exam Normal gynecologic exam in menopause.  Pap test in January 2018 was negative, no indication to repeat this year.  Breast exam normal.  Last mammogram September 2020 was normal per patient.  Colonoscopy in 2017.  Health labs with family physician.  2. Postmenopause Well on no hormone replacement therapy.  No postmenopausal bleeding.  3. Osteopenia of neck of right femur Last bone density October 2019 showed osteopenia.  Vitamin D supplements, calcium intake of 1200 mg daily and regular weightbearing physical activities are recommended.  4. Overweight (BMI 25.0-29.9) Lower calorie/carb diet such as [employer].  Increase physical activities with aerobic activities 5 times a week and weightlifting every 2 days.  Chastelyn, it was a pleasure seeing you today!

## 2019-09-20 ENCOUNTER — Telehealth: Payer: Self-pay | Admitting: General Practice

## 2019-09-20 LAB — NOVEL CORONAVIRUS, NAA: SARS-CoV-2, NAA: NOT DETECTED

## 2019-09-20 NOTE — Telephone Encounter (Signed)
Negative COVID results given. Patient results "NOT Detected." Caller expressed understanding. ° °

## 2019-10-25 ENCOUNTER — Other Ambulatory Visit: Payer: Self-pay

## 2019-10-25 DIAGNOSIS — Z20822 Contact with and (suspected) exposure to covid-19: Secondary | ICD-10-CM

## 2019-10-26 LAB — NOVEL CORONAVIRUS, NAA: SARS-CoV-2, NAA: NOT DETECTED

## 2019-11-21 DIAGNOSIS — H524 Presbyopia: Secondary | ICD-10-CM | POA: Diagnosis not present

## 2020-01-08 ENCOUNTER — Ambulatory Visit: Payer: Managed Care, Other (non HMO) | Attending: Internal Medicine

## 2020-01-08 DIAGNOSIS — Z23 Encounter for immunization: Secondary | ICD-10-CM | POA: Insufficient documentation

## 2020-01-08 NOTE — Progress Notes (Signed)
   Covid-19 Vaccination Clinic  Name:  Erica Trujillo    MRN: [id number] DOB: Sep 16, 1956  01/08/2020  Erica Trujillo was observed post Covid-19 immunization for 15 minutes without incidence. She was provided with Vaccine Information Sheet and instruction to access the V-Safe system.   Erica Trujillo was instructed to call 911 with any severe reactions post vaccine: Marland Kitchen Difficulty breathing  . Swelling of your face and throat  . A fast heartbeat  . A bad rash all over your body  . Dizziness and weakness    Immunizations Administered    Name Date Dose VIS Date Route   Pfizer COVID-19 Vaccine 01/08/2020  6:07 PM 0.3 mL 11/10/2019 Intramuscular   Manufacturer: [employer], [employer]   Lot: [id number]   NDC: [id number]

## 2020-02-02 ENCOUNTER — Ambulatory Visit: Payer: 59 | Attending: Internal Medicine

## 2020-02-02 DIAGNOSIS — Z23 Encounter for immunization: Secondary | ICD-10-CM | POA: Insufficient documentation

## 2020-02-02 NOTE — Progress Notes (Signed)
   Covid-19 Vaccination Clinic  Name:  Erica Trujillo    MRN: [id number] DOB: 1956/06/03  02/02/2020  [name] was observed post Covid-19 immunization for 15 minutes without incident. She was provided with Vaccine Information Sheet and instruction to access the V-Safe system.   [name] was instructed to call 911 with any severe reactions post vaccine: Marland Kitchen Difficulty breathing  . Swelling of face and throat  . A fast heartbeat  . A bad rash all over body  . Dizziness and weakness   Immunizations Administered    Name Date Dose VIS Date Route   Pfizer COVID-19 Vaccine 02/02/2020  5:49 PM 0.3 mL 11/10/2019 Intramuscular   Manufacturer: [employer], [employer]   Lot: [id number]   NDC: [address]-5

## 2020-02-20 DIAGNOSIS — R05 Cough: Secondary | ICD-10-CM | POA: Diagnosis not present

## 2020-02-20 DIAGNOSIS — R0981 Nasal congestion: Secondary | ICD-10-CM | POA: Diagnosis not present

## 2020-07-29 DIAGNOSIS — E785 Hyperlipidemia, unspecified: Secondary | ICD-10-CM | POA: Diagnosis not present

## 2020-07-29 DIAGNOSIS — I10 Essential (primary) hypertension: Secondary | ICD-10-CM | POA: Diagnosis not present

## 2020-10-04 ENCOUNTER — Encounter: Payer: Self-pay | Admitting: Obstetrics & Gynecology

## 2020-10-04 DIAGNOSIS — Z1231 Encounter for screening mammogram for malignant neoplasm of breast: Secondary | ICD-10-CM | POA: Diagnosis not present

## 2020-11-27 DIAGNOSIS — H52223 Regular astigmatism, bilateral: Secondary | ICD-10-CM | POA: Diagnosis not present

## 2020-12-03 ENCOUNTER — Other Ambulatory Visit: Payer: Self-pay | Admitting: Family Medicine

## 2020-12-03 DIAGNOSIS — E785 Hyperlipidemia, unspecified: Secondary | ICD-10-CM | POA: Diagnosis not present

## 2020-12-03 DIAGNOSIS — I10 Essential (primary) hypertension: Secondary | ICD-10-CM | POA: Diagnosis not present

## 2020-12-03 DIAGNOSIS — F17200 Nicotine dependence, unspecified, uncomplicated: Secondary | ICD-10-CM

## 2020-12-03 DIAGNOSIS — Z0000 Encounter for general adult medical examination without abnormal findings: Secondary | ICD-10-CM | POA: Diagnosis not present

## 2020-12-06 ENCOUNTER — Other Ambulatory Visit: Payer: Self-pay | Admitting: Family Medicine

## 2020-12-06 DIAGNOSIS — R351 Nocturia: Secondary | ICD-10-CM

## 2020-12-10 ENCOUNTER — Other Ambulatory Visit: Payer: Self-pay

## 2020-12-10 ENCOUNTER — Ambulatory Visit: Payer: Medicare Other

## 2020-12-10 DIAGNOSIS — I517 Cardiomegaly: Secondary | ICD-10-CM | POA: Diagnosis not present

## 2020-12-10 DIAGNOSIS — R351 Nocturia: Secondary | ICD-10-CM

## 2020-12-17 ENCOUNTER — Inpatient Hospital Stay: Admission: RE | Admit: 2020-12-17 | Payer: 59 | Source: Ambulatory Visit

## 2020-12-18 DIAGNOSIS — Z23 Encounter for immunization: Secondary | ICD-10-CM | POA: Diagnosis not present

## 2021-01-01 ENCOUNTER — Ambulatory Visit
Admission: RE | Admit: 2021-01-01 | Discharge: 2021-01-01 | Disposition: A | Discharge status: Home / Self Care | Payer: Medicare Other | Source: Ambulatory Visit | Attending: Family Medicine | Admitting: Family Medicine

## 2021-01-01 DIAGNOSIS — I77810 Thoracic aortic ectasia: Secondary | ICD-10-CM | POA: Diagnosis not present

## 2021-01-01 DIAGNOSIS — K449 Diaphragmatic hernia without obstruction or gangrene: Secondary | ICD-10-CM | POA: Diagnosis not present

## 2021-01-01 DIAGNOSIS — F17200 Nicotine dependence, unspecified, uncomplicated: Secondary | ICD-10-CM

## 2021-01-01 DIAGNOSIS — I2510 Atherosclerotic heart disease of native coronary artery without angina pectoris: Secondary | ICD-10-CM | POA: Diagnosis not present

## 2021-01-01 DIAGNOSIS — F17210 Nicotine dependence, cigarettes, uncomplicated: Secondary | ICD-10-CM | POA: Diagnosis not present

## 2021-01-14 DIAGNOSIS — I10 Essential (primary) hypertension: Secondary | ICD-10-CM | POA: Diagnosis not present

## 2021-01-14 DIAGNOSIS — E785 Hyperlipidemia, unspecified: Secondary | ICD-10-CM | POA: Diagnosis not present

## 2021-01-14 DIAGNOSIS — Z720 Tobacco use: Secondary | ICD-10-CM | POA: Diagnosis not present

## 2021-04-29 DIAGNOSIS — M19049 Primary osteoarthritis, unspecified hand: Secondary | ICD-10-CM | POA: Diagnosis not present

## 2021-04-29 DIAGNOSIS — I10 Essential (primary) hypertension: Secondary | ICD-10-CM | POA: Diagnosis not present

## 2021-04-29 DIAGNOSIS — E785 Hyperlipidemia, unspecified: Secondary | ICD-10-CM | POA: Diagnosis not present

## 2021-05-09 DIAGNOSIS — E785 Hyperlipidemia, unspecified: Secondary | ICD-10-CM | POA: Diagnosis not present

## 2021-05-09 DIAGNOSIS — I10 Essential (primary) hypertension: Secondary | ICD-10-CM | POA: Diagnosis not present

## 2021-05-14 DIAGNOSIS — I10 Essential (primary) hypertension: Secondary | ICD-10-CM | POA: Diagnosis not present

## 2021-05-29 DIAGNOSIS — E785 Hyperlipidemia, unspecified: Secondary | ICD-10-CM | POA: Diagnosis not present

## 2021-05-29 DIAGNOSIS — I10 Essential (primary) hypertension: Secondary | ICD-10-CM | POA: Diagnosis not present

## 2021-05-29 DIAGNOSIS — M19049 Primary osteoarthritis, unspecified hand: Secondary | ICD-10-CM | POA: Diagnosis not present

## 2021-07-30 DIAGNOSIS — M19049 Primary osteoarthritis, unspecified hand: Secondary | ICD-10-CM | POA: Diagnosis not present

## 2021-07-30 DIAGNOSIS — I10 Essential (primary) hypertension: Secondary | ICD-10-CM | POA: Diagnosis not present

## 2021-07-30 DIAGNOSIS — E785 Hyperlipidemia, unspecified: Secondary | ICD-10-CM | POA: Diagnosis not present

## 2021-09-15 DIAGNOSIS — Z23 Encounter for immunization: Secondary | ICD-10-CM | POA: Diagnosis not present

## 2021-09-15 DIAGNOSIS — E785 Hyperlipidemia, unspecified: Secondary | ICD-10-CM | POA: Diagnosis not present

## 2021-09-15 DIAGNOSIS — I739 Peripheral vascular disease, unspecified: Secondary | ICD-10-CM | POA: Diagnosis not present

## 2021-09-15 DIAGNOSIS — Z720 Tobacco use: Secondary | ICD-10-CM | POA: Diagnosis not present

## 2021-09-15 DIAGNOSIS — M19049 Primary osteoarthritis, unspecified hand: Secondary | ICD-10-CM | POA: Diagnosis not present

## 2021-09-15 DIAGNOSIS — I10 Essential (primary) hypertension: Secondary | ICD-10-CM | POA: Diagnosis not present

## 2021-10-04 ENCOUNTER — Other Ambulatory Visit: Payer: Self-pay

## 2021-10-04 DIAGNOSIS — I739 Peripheral vascular disease, unspecified: Secondary | ICD-10-CM

## 2021-10-14 NOTE — Progress Notes (Signed)
Office Note     CC: Concern for peripheral arterial disease Requesting Provider:  Mirna Mires, MD  HPI: Erica Trujillo is a 65 y.o. ([birth year]) female presenting at the request of .Mirna Mires, MD with concern for peripheral arterial disease.   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  She spends most of her time sitting, however every 15 minutes, takes a break and walks around.  She notes cramping in her eyes when sitting however at this resolves with movement.  She is able to ambulate without issue.  No symptoms of claudication.  Last time she felt her thighs cramp was while driving in the car on the way to this visit.  Erica Trujillo also appreciates cramping at night.  She notes drinking roughly 16 to 24 ounces of water daily.  She denies rest pain, tissue loss.  The pt is  on a statin for cholesterol management.  The pt is  on a daily aspirin.   Other AC:  - The pt is  on medication for hypertension.   The pt is not diabetic.  Tobacco hx:  daily  Past Medical History:  Diagnosis Date   Hypertension     Past Surgical History:  Procedure Laterality Date   TUBAL LIGATION      Social History   Socioeconomic History   Marital status: Widowed    Spouse name: Not on file   Number of children: Not on file   Years of education: Not on file   Highest education level: Not on file  Occupational History   Not on file  Tobacco Use   Smoking status: Every Day    Packs/day: 0.20    Years: 18.00    Pack years: 3.60    Types: Cigarettes   Smokeless tobacco: Never  Vaping Use   Vaping Use: Never used  Substance and Sexual Activity   Alcohol use: Yes    Comment: SOCIAL   Drug use: No   Sexual activity: Not Currently    Partners: Male    Comment: 1st intercourse- 66, partners- 3, widow  Other Topics Concern   Not on file  Social History Narrative   Not on file   Social Determinants of Health   Financial Resource Strain: Not on file  Food Insecurity: Not on file  Transportation  Needs: Not on file  Physical Activity: Not on file  Stress: Not on file  Social Connections: Not on file  Intimate Partner Violence: Not on file    Family History  Problem Relation Age of Onset   Heart disease Mother    Diabetes Mother    Heart disease Father    Heart attack Father    Cancer Sister        melanoma   Cancer Brother        liver and colon    Current Outpatient Medications  Medication Sig Dispense Refill   amLODipine (NORVASC) 10 MG tablet Take 10 mg by mouth daily.     aspirin 81 MG tablet Take 81 mg by mouth daily.     hydrochlorothiazide (HYDRODIURIL) 25 MG tablet Take 25 mg by mouth daily.     nebivolol (BYSTOLIC) 10 MG tablet Take 40 mg by mouth daily.     olmesartan (BENICAR) 40 MG tablet Take 40 mg by mouth daily.     rosuvastatin (CRESTOR) 5 MG tablet Take 5 mg by mouth daily.     No current facility-administered medications for this visit.    Allergies  Allergen Reactions  Penicillins      REVIEW OF SYSTEMS:   [X]  denotes positive finding, [ ]  denotes negative finding Cardiac  Comments:  Chest pain or chest pressure:    Shortness of breath upon exertion:    Short of breath when lying flat:    Irregular heart rhythm:        Vascular    Pain in calf, thigh, or hip brought on by ambulation:    Pain in feet at night that wakes you up from your sleep:     Blood clot in your veins:    Leg swelling:         Pulmonary    Oxygen at home:    Productive cough:     Wheezing:         Neurologic    Sudden weakness in arms or legs:     Sudden numbness in arms or legs:     Sudden onset of difficulty speaking or slurred speech:    Temporary loss of vision in one eye:     Problems with dizziness:         Gastrointestinal    Blood in stool:     Vomited blood:         Genitourinary    Burning when urinating:     Blood in urine:        Psychiatric    Major depression:         Hematologic    Bleeding problems:    Problems with blood  clotting too easily:        Skin    Rashes or ulcers:        Constitutional    Fever or chills:      PHYSICAL EXAMINATION:  There were no vitals filed for this visit.  General:  WDWN in NAD; vital signs documented above Gait: Not observed HENT: WNL, normocephalic Pulmonary: normal non-labored breathing , without Rales, rhonchi,  wheezing Cardiac: regular HR Abdomen: soft, NT, no masses Skin: without rashes Vascular Exam/Pulses:  Right Left  Radial 2+ (normal) 2+ (normal)  Ulnar 2+ (normal) 2+ (normal)  Femoral    Popliteal    DP 2+ (normal) 2+ (normal)  PT 1+ (weak) 1+ (weak)   Extremities: without ischemic changes, without Gangrene , without cellulitis; without open wounds;  Musculoskeletal: no muscle wasting or atrophy  Neurologic: A&O X 3;  No focal weakness or paresthesias are detected Psychiatric:  The pt has Normal affect.   Non-Invasive Vascular Imaging:     +-------+-----------+-----------+------------+------------+  ABI/TBIToday's ABIToday's TBIPrevious ABIPrevious TBI  +-------+-----------+-----------+------------+------------+  Right  0.99       0.68                                 +-------+-----------+-----------+------------+------------+  Left   0.97       0.64                                 +-------+-----------+-----------+------------+------------+     ASSESSMENT/PLAN: Erica Trujillo is a 65 y.o. female presenting with bilateral leg cramping that occurs when sitting, and sometimes when lying in bed.  She has no symptoms of claudication, rest pain, tissue loss in her lower extremities.  On physical exam, she has a palpable pulse in her feet, and on imaging demonstrates a normal ABI.  Injury there is thigh cramping is not  related to vascular disease.  We discussed the evening cramping and how increasing her fluid intake may help with this.  The patient can follow-up with me as needed.   Victorino Sparrow, MD [facility] [phone]

## 2021-10-17 ENCOUNTER — Ambulatory Visit: Payer: Medicare Other | Admitting: Vascular Surgery

## 2021-10-17 ENCOUNTER — Encounter: Payer: Self-pay | Admitting: Vascular Surgery

## 2021-10-17 ENCOUNTER — Ambulatory Visit (HOSPITAL_COMMUNITY)
Admission: RE | Admit: 2021-10-17 | Discharge: 2021-10-17 | Disposition: A | Discharge status: Home / Self Care | Payer: Medicare Other | Source: Ambulatory Visit | Attending: Vascular Surgery | Admitting: Vascular Surgery

## 2021-10-17 ENCOUNTER — Other Ambulatory Visit: Payer: Self-pay

## 2021-10-17 VITALS — BP 139/89 | HR 73 | Temp 98.0°F | Resp 20 | Ht 66.0 in | Wt 192.0 lb

## 2021-10-17 DIAGNOSIS — I739 Peripheral vascular disease, unspecified: Secondary | ICD-10-CM

## 2021-10-17 DIAGNOSIS — R252 Cramp and spasm: Secondary | ICD-10-CM

## 2021-10-28 IMAGING — CT CT CHEST LUNG CANCER SCREENING LOW DOSE W/O CM
2 of 5 series · 15 of 40 positions shown, 18 images · non-contrast
Comparison: None.

CLINICAL DATA: 64-year-old asymptomatic female current smoker with
32 pack-year smoking history.

EXAM:
CT CHEST WITHOUT CONTRAST LOW-DOSE FOR LUNG CANCER SCREENING
TECHNIQUE: Multidetector CT imaging of the chest was performed following the
standard protocol without IV contrast.

[Series 4: lung 1.00 br44 cor · coronal · 0.61mm/px · 3 of 352 slices shown]
[im 71/352  lung]
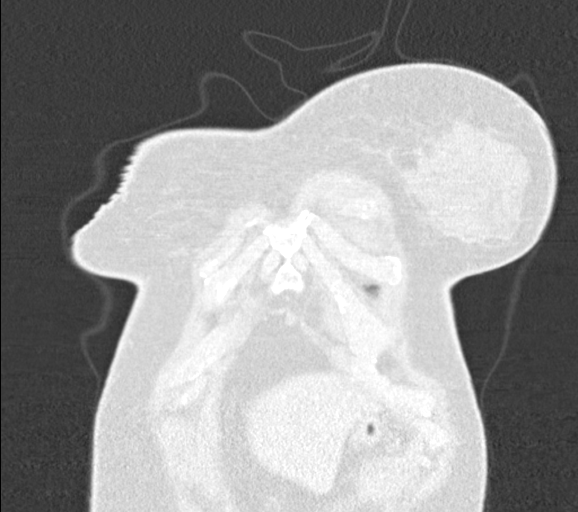
[im 141/352  lung]
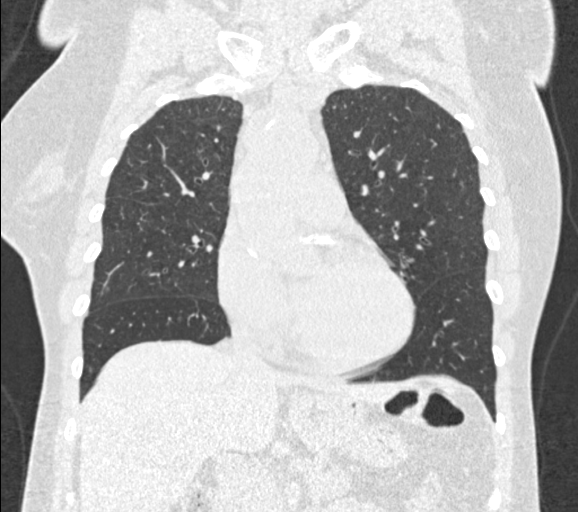
[im 211/352  lung]
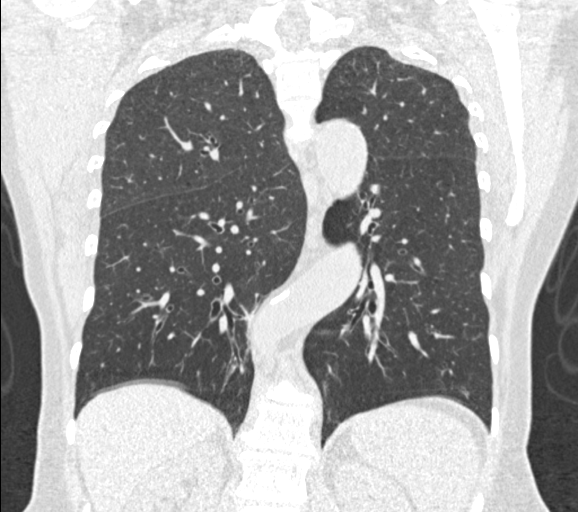

[Series 9: lung 1.00 br60 axial · axial · 0.69mm/px · z∈[-1249,-968]mm · 12 of 311 slices shown, 15 images]
[im 15/311  mediastinal]
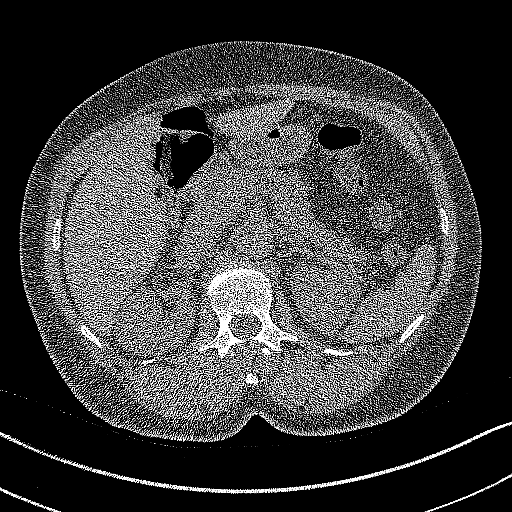
[im 15/311  lung]
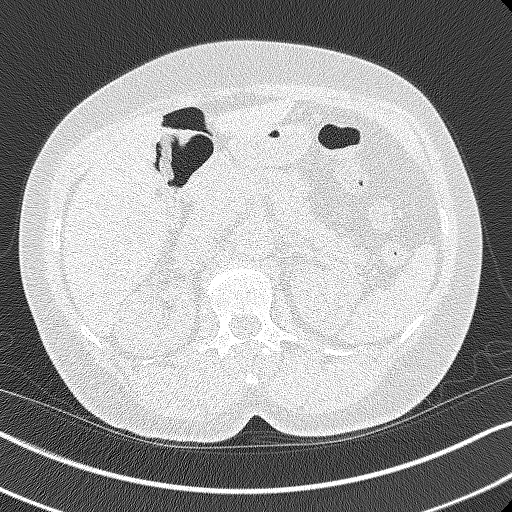
[im 43/311  lung]
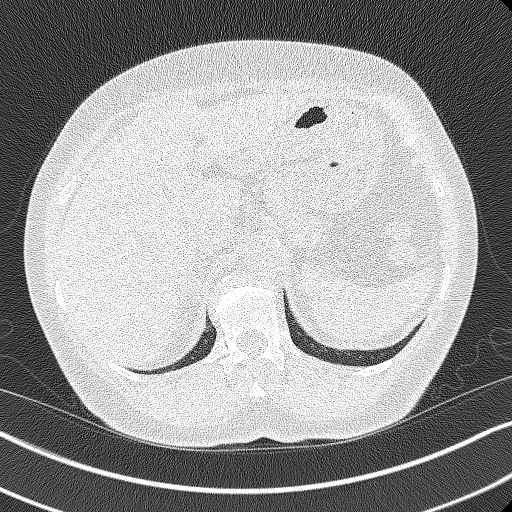
[im 71/311  lung]
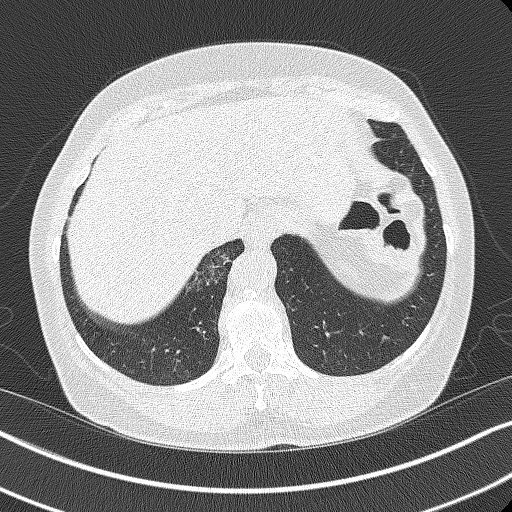
[im 99/311  lung]
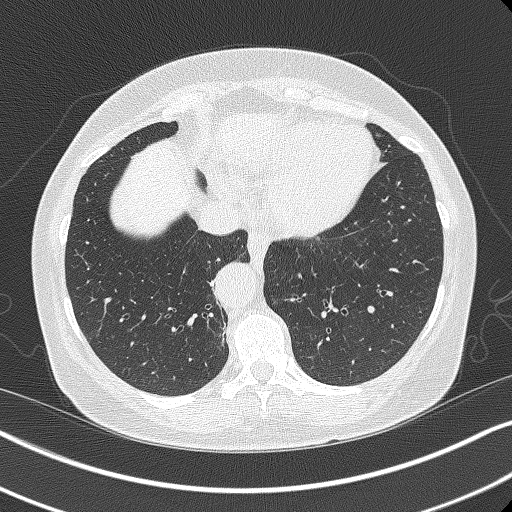
[im 113/311  mediastinal]
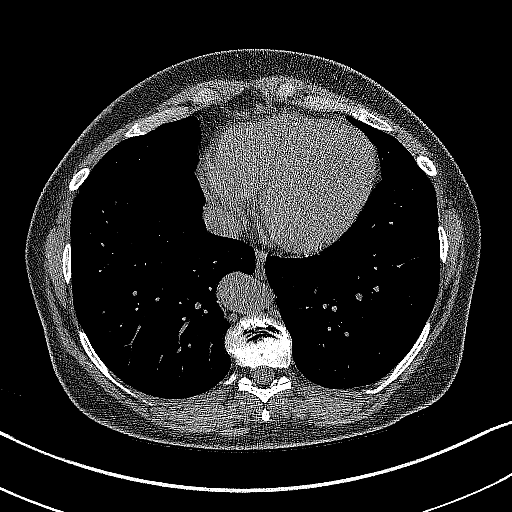
[im 113/311  lung]
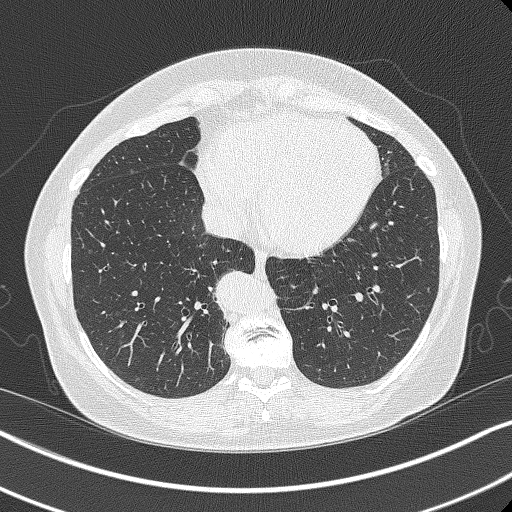
[im 141/311  lung]
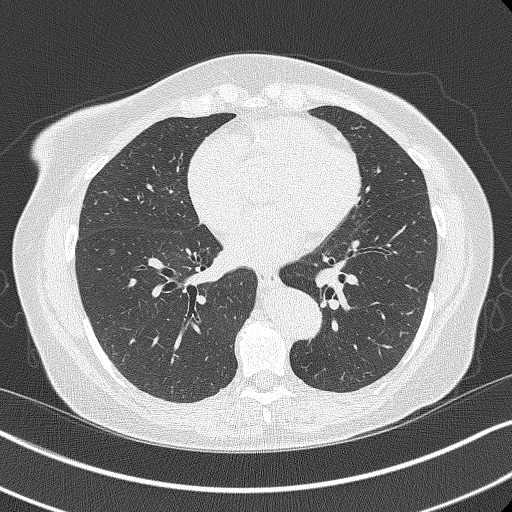
[im 170/311  lung]
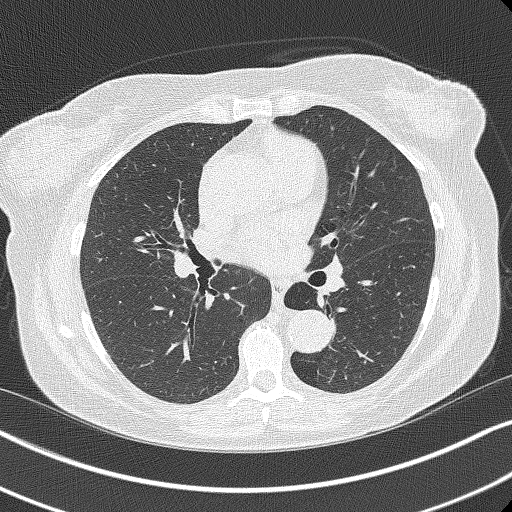
[im 198/311  lung]
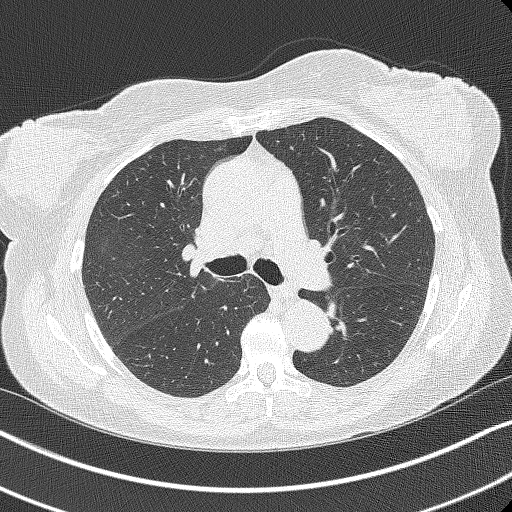
[im 212/311  mediastinal]
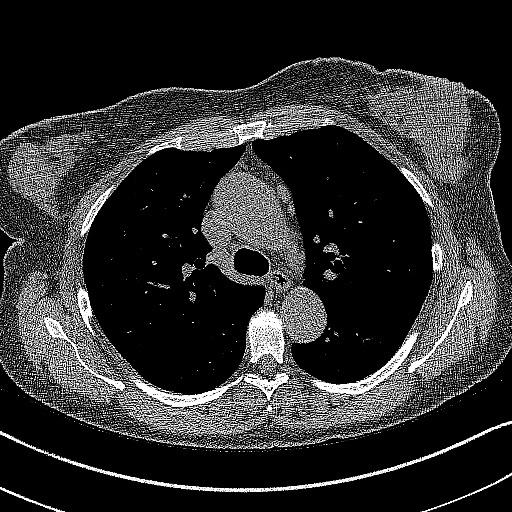
[im 212/311  lung]
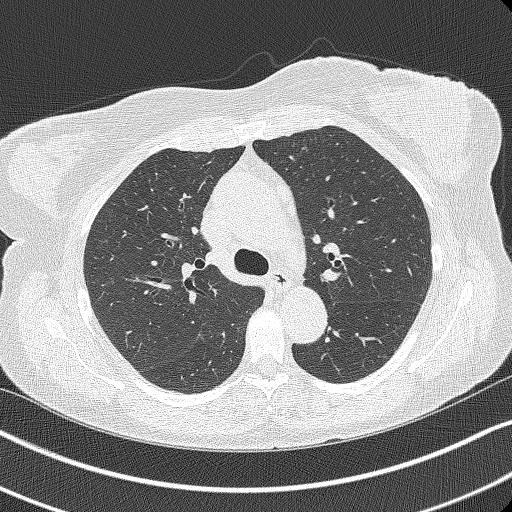
[im 240/311  lung]
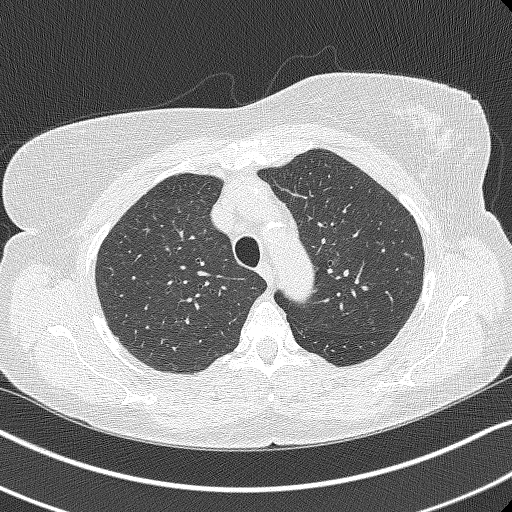
[im 268/311  lung]
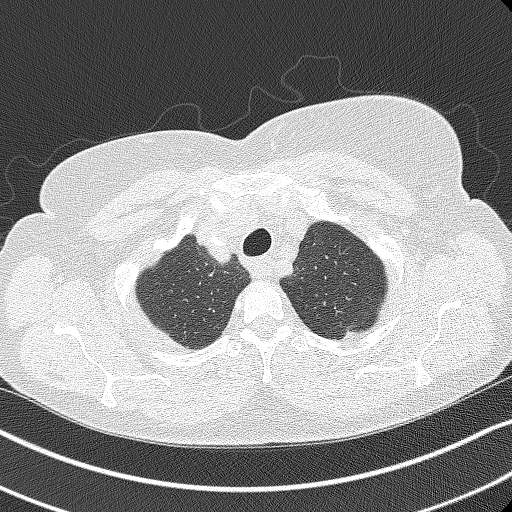
[im 296/311  lung]
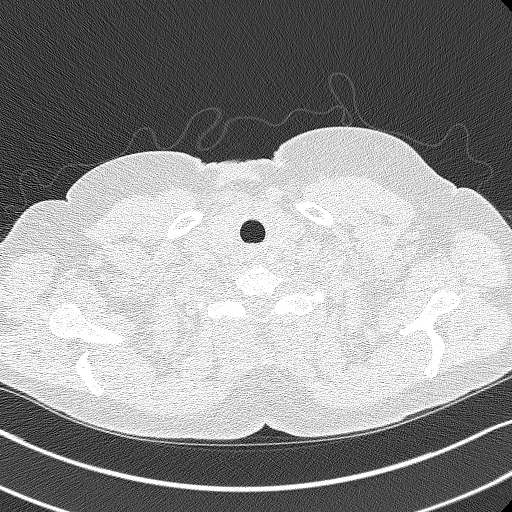

[15 of 40 positions shown; findings below may reference images not displayed]

FINDINGS: Cardiovascular: Normal heart size. No significant pericardial
effusion/thickening. Three-vessel coronary atherosclerosis.
Atherosclerotic thoracic aorta with ectatic 4.1 cm ascending
thoracic aorta. Normal caliber pulmonary arteries.

Mediastinum/Nodes: No discrete thyroid nodules. Unremarkable
esophagus. No pathologically enlarged axillary, mediastinal or hilar
lymph nodes, noting limited sensitivity for the detection of hilar
adenopathy on this noncontrast study.

Lungs/Pleura: No pneumothorax. No pleural effusion. Mild
centrilobular emphysema. No acute consolidative airspace disease or
lung masses. Small scattered bilateral pulmonary nodules, largest
3.6 mm in volume derived mean diameter in the anterior left lower
lobe (series 9/image 136).

Upper abdomen: Small hiatal hernia.

Musculoskeletal: No aggressive appearing focal osseous lesions.
Moderate thoracic spondylosis.
IMPRESSION: 1. Lung-RADS 2, benign appearance or behavior. Continue annual
screening with low-dose chest CT without contrast in 12 months.
2. Three-vessel coronary atherosclerosis.
3. Ectatic 4.1 cm ascending thoracic aorta, which can be reassessed
on follow-up screening chest CT in 12 months.
4. Small hiatal hernia.
5. Aortic Atherosclerosis (1QPFF-E6C.C) and Emphysema (1QPFF-RFA.I).

## 2021-10-29 DIAGNOSIS — M19049 Primary osteoarthritis, unspecified hand: Secondary | ICD-10-CM | POA: Diagnosis not present

## 2021-10-29 DIAGNOSIS — I10 Essential (primary) hypertension: Secondary | ICD-10-CM | POA: Diagnosis not present

## 2021-10-29 DIAGNOSIS — E785 Hyperlipidemia, unspecified: Secondary | ICD-10-CM | POA: Diagnosis not present

## 2021-11-14 DIAGNOSIS — Z1231 Encounter for screening mammogram for malignant neoplasm of breast: Secondary | ICD-10-CM | POA: Diagnosis not present

## 2021-11-17 ENCOUNTER — Encounter: Payer: Self-pay | Admitting: Obstetrics & Gynecology

## 2021-11-25 DIAGNOSIS — H52223 Regular astigmatism, bilateral: Secondary | ICD-10-CM | POA: Diagnosis not present

## 2021-12-28 DIAGNOSIS — I10 Essential (primary) hypertension: Secondary | ICD-10-CM | POA: Diagnosis not present

## 2021-12-28 DIAGNOSIS — M19049 Primary osteoarthritis, unspecified hand: Secondary | ICD-10-CM | POA: Diagnosis not present

## 2021-12-28 DIAGNOSIS — E785 Hyperlipidemia, unspecified: Secondary | ICD-10-CM | POA: Diagnosis not present

## 2022-01-16 DIAGNOSIS — E785 Hyperlipidemia, unspecified: Secondary | ICD-10-CM | POA: Diagnosis not present

## 2022-01-16 DIAGNOSIS — I10 Essential (primary) hypertension: Secondary | ICD-10-CM | POA: Diagnosis not present

## 2022-01-16 DIAGNOSIS — I739 Peripheral vascular disease, unspecified: Secondary | ICD-10-CM | POA: Diagnosis not present

## 2022-01-16 DIAGNOSIS — Z720 Tobacco use: Secondary | ICD-10-CM | POA: Diagnosis not present

## 2022-03-19 DIAGNOSIS — M19012 Primary osteoarthritis, left shoulder: Secondary | ICD-10-CM | POA: Diagnosis not present

## 2022-03-19 DIAGNOSIS — M940 Chondrocostal junction syndrome [Tietze]: Secondary | ICD-10-CM | POA: Diagnosis not present

## 2022-05-18 DIAGNOSIS — M150 Primary generalized (osteo)arthritis: Secondary | ICD-10-CM | POA: Diagnosis not present

## 2022-05-18 DIAGNOSIS — Z0000 Encounter for general adult medical examination without abnormal findings: Secondary | ICD-10-CM | POA: Diagnosis not present

## 2022-05-18 DIAGNOSIS — I10 Essential (primary) hypertension: Secondary | ICD-10-CM | POA: Diagnosis not present

## 2022-09-18 DIAGNOSIS — E559 Vitamin D deficiency, unspecified: Secondary | ICD-10-CM | POA: Diagnosis not present

## 2022-09-18 DIAGNOSIS — E785 Hyperlipidemia, unspecified: Secondary | ICD-10-CM | POA: Diagnosis not present

## 2022-09-18 DIAGNOSIS — I10 Essential (primary) hypertension: Secondary | ICD-10-CM | POA: Diagnosis not present

## 2022-09-18 DIAGNOSIS — R7303 Prediabetes: Secondary | ICD-10-CM | POA: Diagnosis not present

## 2022-09-18 DIAGNOSIS — I739 Peripheral vascular disease, unspecified: Secondary | ICD-10-CM | POA: Diagnosis not present

## 2022-10-06 DIAGNOSIS — E785 Hyperlipidemia, unspecified: Secondary | ICD-10-CM | POA: Diagnosis not present

## 2022-10-06 DIAGNOSIS — I10 Essential (primary) hypertension: Secondary | ICD-10-CM | POA: Diagnosis not present

## 2022-10-06 DIAGNOSIS — R7303 Prediabetes: Secondary | ICD-10-CM | POA: Diagnosis not present

## 2022-11-11 DIAGNOSIS — H2513 Age-related nuclear cataract, bilateral: Secondary | ICD-10-CM | POA: Diagnosis not present

## 2022-11-24 DIAGNOSIS — Z1231 Encounter for screening mammogram for malignant neoplasm of breast: Secondary | ICD-10-CM | POA: Diagnosis not present

## 2023-01-05 DIAGNOSIS — E785 Hyperlipidemia, unspecified: Secondary | ICD-10-CM | POA: Diagnosis not present

## 2023-01-05 DIAGNOSIS — I10 Essential (primary) hypertension: Secondary | ICD-10-CM | POA: Diagnosis not present

## 2023-04-12 DIAGNOSIS — E785 Hyperlipidemia, unspecified: Secondary | ICD-10-CM | POA: Diagnosis not present

## 2023-04-12 DIAGNOSIS — I10 Essential (primary) hypertension: Secondary | ICD-10-CM | POA: Diagnosis not present

## 2023-04-12 DIAGNOSIS — E7800 Pure hypercholesterolemia, unspecified: Secondary | ICD-10-CM | POA: Diagnosis not present

## 2023-10-04 DIAGNOSIS — I10 Essential (primary) hypertension: Secondary | ICD-10-CM | POA: Diagnosis not present

## 2023-10-04 DIAGNOSIS — E7800 Pure hypercholesterolemia, unspecified: Secondary | ICD-10-CM | POA: Diagnosis not present

## 2023-11-09 DIAGNOSIS — J219 Acute bronchiolitis, unspecified: Secondary | ICD-10-CM | POA: Diagnosis not present

## 2023-11-25 DIAGNOSIS — H25813 Combined forms of age-related cataract, bilateral: Secondary | ICD-10-CM | POA: Diagnosis not present

## 2023-11-25 DIAGNOSIS — H524 Presbyopia: Secondary | ICD-10-CM | POA: Diagnosis not present

## 2023-11-25 DIAGNOSIS — H43813 Vitreous degeneration, bilateral: Secondary | ICD-10-CM | POA: Diagnosis not present

## 2023-11-30 DIAGNOSIS — Z1231 Encounter for screening mammogram for malignant neoplasm of breast: Secondary | ICD-10-CM | POA: Diagnosis not present

## 2024-01-11 DIAGNOSIS — E785 Hyperlipidemia, unspecified: Secondary | ICD-10-CM | POA: Diagnosis not present

## 2024-01-11 DIAGNOSIS — R7303 Prediabetes: Secondary | ICD-10-CM | POA: Diagnosis not present

## 2024-01-11 DIAGNOSIS — I10 Essential (primary) hypertension: Secondary | ICD-10-CM | POA: Diagnosis not present

## 2024-02-04 DIAGNOSIS — M8588 Other specified disorders of bone density and structure, other site: Secondary | ICD-10-CM | POA: Diagnosis not present

## 2024-02-18 DIAGNOSIS — N61 Mastitis without abscess: Secondary | ICD-10-CM | POA: Diagnosis not present

## 2024-02-29 DIAGNOSIS — N6341 Unspecified lump in right breast, subareolar: Secondary | ICD-10-CM | POA: Diagnosis not present

## 2024-02-29 DIAGNOSIS — N641 Fat necrosis of breast: Secondary | ICD-10-CM | POA: Diagnosis not present

## 2024-02-29 DIAGNOSIS — R92341 Mammographic extreme density, right breast: Secondary | ICD-10-CM | POA: Diagnosis not present

## 2024-02-29 DIAGNOSIS — N6311 Unspecified lump in the right breast, upper outer quadrant: Secondary | ICD-10-CM | POA: Diagnosis not present

## 2024-03-03 DIAGNOSIS — N61 Mastitis without abscess: Secondary | ICD-10-CM | POA: Diagnosis not present

## 2024-03-28 DIAGNOSIS — H25013 Cortical age-related cataract, bilateral: Secondary | ICD-10-CM | POA: Diagnosis not present

## 2024-03-28 DIAGNOSIS — H2513 Age-related nuclear cataract, bilateral: Secondary | ICD-10-CM | POA: Diagnosis not present

## 2024-03-28 DIAGNOSIS — H2511 Age-related nuclear cataract, right eye: Secondary | ICD-10-CM | POA: Diagnosis not present

## 2024-03-28 DIAGNOSIS — H40023 Open angle with borderline findings, high risk, bilateral: Secondary | ICD-10-CM | POA: Diagnosis not present

## 2024-03-28 DIAGNOSIS — H18413 Arcus senilis, bilateral: Secondary | ICD-10-CM | POA: Diagnosis not present

## 2024-04-11 DIAGNOSIS — N6081 Other benign mammary dysplasias of right breast: Secondary | ICD-10-CM | POA: Diagnosis not present

## 2024-05-19 DIAGNOSIS — I10 Essential (primary) hypertension: Secondary | ICD-10-CM | POA: Diagnosis not present

## 2024-05-19 DIAGNOSIS — M5432 Sciatica, left side: Secondary | ICD-10-CM | POA: Diagnosis not present

## 2024-06-07 DIAGNOSIS — H2511 Age-related nuclear cataract, right eye: Secondary | ICD-10-CM | POA: Diagnosis not present

## 2024-06-08 DIAGNOSIS — H2512 Age-related nuclear cataract, left eye: Secondary | ICD-10-CM | POA: Diagnosis not present

## 2024-06-21 DIAGNOSIS — H2512 Age-related nuclear cataract, left eye: Secondary | ICD-10-CM | POA: Diagnosis not present

## 2024-07-21 DIAGNOSIS — Z9841 Cataract extraction status, right eye: Secondary | ICD-10-CM | POA: Diagnosis not present

## 2024-07-21 DIAGNOSIS — Z961 Presence of intraocular lens: Secondary | ICD-10-CM | POA: Diagnosis not present

## 2024-07-21 DIAGNOSIS — Z9842 Cataract extraction status, left eye: Secondary | ICD-10-CM | POA: Diagnosis not present

## 2024-07-21 DIAGNOSIS — H524 Presbyopia: Secondary | ICD-10-CM | POA: Diagnosis not present

## 2024-09-15 DIAGNOSIS — E559 Vitamin D deficiency, unspecified: Secondary | ICD-10-CM | POA: Diagnosis not present

## 2024-09-15 DIAGNOSIS — E785 Hyperlipidemia, unspecified: Secondary | ICD-10-CM | POA: Diagnosis not present

## 2024-09-15 DIAGNOSIS — M5432 Sciatica, left side: Secondary | ICD-10-CM | POA: Diagnosis not present

## 2024-09-15 DIAGNOSIS — R7303 Prediabetes: Secondary | ICD-10-CM | POA: Diagnosis not present

## 2024-09-15 DIAGNOSIS — I10 Essential (primary) hypertension: Secondary | ICD-10-CM | POA: Diagnosis not present

## 2024-10-31 ENCOUNTER — Inpatient Hospital Stay: Admission: RE | Admit: 2024-10-31 | Discharge: 2024-10-31 | Attending: Student

## 2024-10-31 VITALS — BP 119/77 | HR 85 | Temp 98.7°F | Resp 17

## 2024-10-31 DIAGNOSIS — J069 Acute upper respiratory infection, unspecified: Secondary | ICD-10-CM

## 2024-10-31 MED ORDER — PREDNISONE 10 MG PO TABS: 20.0000 mg | ORAL_TABLET | Freq: Every day | ORAL | 0 refills | Status: AC

## 2024-10-31 NOTE — Discharge Instructions (Addendum)
-  Prednisone, 2 pills taken at the same time for 5 days in a row.  Try taking this earlier in the day as it can give you energy. Avoid NSAIDs like ibuprofen and alleve while taking this medication as they can increase your risk of stomach upset and even GI bleeding when in combination with a steroid.  -You can take tylenol  (acetaminophen ) up to 1000mg  3x daily. -Your cough should slowly get better instead of worse. If you develop a cough productive of dark or red sputum, new shortness of breath, new chest tightness, new fevers, etc - seek additional care.

## 2024-10-31 NOTE — ED Provider Notes (Signed)
 Erica Trujillo    CSN: [id number] Arrival date & time: 10/31/24  0818      History   Chief Complaint Chief Complaint  Patient presents with   Cough    Not feeling well. Cough for a few days with fatigue and loss of appetite. - Entered by patient    HPI Erica Trujillo is a 68 y.o. female presenting with cough, fatigue, and decreased appetite. History hypertension, Current smoker. Pt c/o cough productive of clear sputum, loss of appetite, body aches, chills, and fatigue since Saturday 10/28/24 (4 days). Denies shortness of breath. Has attempted Quercetin. The cough is the most bothersome symptom.   HPI  Past Medical History:  Diagnosis Date   Hypertension     There are no active problems to display for this patient.   Past Surgical History:  Procedure Laterality Date   TUBAL LIGATION      OB History     Gravida  3   Para  1   Term      Preterm      AB  2   Living  1      SAB      IAB      Ectopic      Multiple      Live Births               Home Medications    Prior to Admission medications   Medication Sig Start Date End Date Taking? Authorizing Provider  predniSONE (DELTASONE) 10 MG tablet Take 2 tablets (20 mg total) by mouth daily for 5 days. 10/31/24 11/05/24 Yes Mahalie Kanner E, PA-C  amLODipine (NORVASC) 10 MG tablet Take 10 mg by mouth daily.    [provider]  aspirin 81 MG tablet Take 81 mg by mouth daily.    [provider]  hydrochlorothiazide (HYDRODIURIL) 25 MG tablet Take 25 mg by mouth daily.    [provider]  nebivolol (BYSTOLIC) 10 MG tablet Take 40 mg by mouth daily.    [provider]  olmesartan (BENICAR) 40 MG tablet Take 40 mg by mouth daily.    [provider]  rosuvastatin (CRESTOR) 5 MG tablet Take 5 mg by mouth daily.    [provider]    Family History Family History  Problem Relation Age of Onset   Heart disease Mother    Diabetes Mother     Heart disease Father    Heart attack Father    Cancer Sister        melanoma   Cancer Brother        liver and colon    Social History Social History   Tobacco Use   Smoking status: Every Day    Current packs/day: 0.25    Average packs/day: 0.3 packs/day for 18.0 years (4.5 ttl pk-yrs)    Types: Cigarettes   Smokeless tobacco: Never  Vaping Use   Vaping status: Never Used  Substance Use Topics   Alcohol use: Yes    Comment: SOCIAL   Drug use: No     Allergies   Penicillins   Review of Systems Review of Systems  Constitutional:  Positive for fatigue. Negative for appetite change, chills and fever.  HENT:  Negative for congestion, ear pain, rhinorrhea, sinus pressure, sinus pain and sore throat.   Eyes:  Negative for redness and visual disturbance.  Respiratory:  Positive for cough. Negative for chest tightness, shortness of breath and wheezing.   Cardiovascular:  Negative for chest pain and palpitations.  Gastrointestinal:  Negative for abdominal pain, constipation, diarrhea, nausea and vomiting.  Genitourinary:  Negative for dysuria, frequency and urgency.  Musculoskeletal:  Negative for myalgias.  Neurological:  Negative for dizziness, weakness and headaches.  Psychiatric/Behavioral:  Negative for confusion.   All other systems reviewed and are negative.    Physical Exam Triage Vital Signs ED Triage Vitals  Encounter Vitals Group     BP      Girls Systolic BP Percentile      Girls Diastolic BP Percentile      Boys Systolic BP Percentile      Boys Diastolic BP Percentile      Pulse      Resp      Temp      Temp src      SpO2      Weight      Height      Head Circumference      Peak Flow      Pain Score      Pain Loc      Pain Education      Exclude from Growth Chart    No data found.  Updated Vital Signs BP 119/77 (BP Location: Right Arm)   Pulse 85   Temp 98.7 F (37.1 C) (Oral)   Resp 17   SpO2 94%   Visual Acuity Right Eye  Distance:   Left Eye Distance:   Bilateral Distance:    Right Eye Near:   Left Eye Near:    Bilateral Near:     Physical Exam Vitals reviewed.  Constitutional:      General: She is not in acute distress.    Appearance: Normal appearance. She is not ill-appearing.  HENT:     Head: Normocephalic and atraumatic.     Right Ear: Tympanic membrane, ear canal and external ear normal. No tenderness. No middle ear effusion. There is no impacted cerumen. Tympanic membrane is not perforated, erythematous, retracted or bulging.     Left Ear: Tympanic membrane, ear canal and external ear normal. No tenderness.  No middle ear effusion. There is no impacted cerumen. Tympanic membrane is not perforated, erythematous, retracted or bulging.     Nose: Nose normal. No congestion.     Mouth/Throat:     Mouth: Mucous membranes are moist.     Pharynx: Uvula midline. No oropharyngeal exudate or posterior oropharyngeal erythema.     Tonsils: No tonsillar exudate.  Eyes:     Extraocular Movements: Extraocular movements intact.     Pupils: Pupils are equal, round, and reactive to light.  Cardiovascular:     Rate and Rhythm: Normal rate and regular rhythm.     Heart sounds: Normal heart sounds.  Pulmonary:     Effort: Pulmonary effort is normal.     Breath sounds: Normal breath sounds. No decreased breath sounds, wheezing, rhonchi or rales.  Abdominal:     Palpations: Abdomen is soft.     Tenderness: There is no abdominal tenderness. There is no guarding or rebound.  Lymphadenopathy:     Cervical: No cervical adenopathy.     Right cervical: No superficial, deep or posterior cervical adenopathy.    Left cervical: No superficial, deep or posterior cervical adenopathy.  Skin:    Comments: No rash   Neurological:     General: No focal deficit present.     Mental Status: She is alert and oriented to person, place, and time.  Psychiatric:  Mood and Affect: Mood normal.        Behavior: Behavior  normal.        Thought Content: Thought content normal.        Judgment: Judgment normal.      [place] Treatments / Results  Labs (all labs ordered are listed, but only abnormal results are displayed) Labs Reviewed - No data to display  EKG   Radiology No results found.  Procedures Procedures (including critical care time)  Medications Ordered in [place] Medications - No data to display  Initial Impression / Assessment and Plan / [place] Course  I have reviewed the triage vital signs and the nursing notes.  Pertinent labs & imaging results that were available during my care of the patient were reviewed by me and considered in my medical decision making (see chart for details).     Patient is a pleasant 68 year old female presenting with viral URI with cough The patient is afebrile and nontachycardic.  Antipyretic has not been administered today. Lungs are clear to auscultation.  She is a current smoker, does not have a history of pulmonary disease or COPD.  No current inhalers.  Will manage with prednisone burst. she is not a diabetic.  Return precautions as below  Final Clinical Impressions(s) / [place] Diagnoses   Final diagnoses:  Viral URI with cough     Discharge Instructions      -Prednisone, 2 pills taken at the same time for 5 days in a row.  Try taking this earlier in the day as it can give you energy. Avoid NSAIDs like ibuprofen and alleve while taking this medication as they can increase your risk of stomach upset and even GI bleeding when in combination with a steroid.  -You can take tylenol  (acetaminophen ) up to 1000mg  3x daily. -Your cough should slowly get better instead of worse. If you develop a cough productive of dark or red sputum, new shortness of breath, new chest tightness, new fevers, etc - seek additional care.      ED Prescriptions     Medication Sig Dispense Auth. Provider   predniSONE (DELTASONE) 10 MG tablet Take 2 tablets (20 mg total) by mouth daily  for 5 days. 10 tablet Minette Manders E, PA-C      PDMP not reviewed this encounter.   Arlyss Leita BRAVO, PA-C 10/31/24 [phone]

## 2024-10-31 NOTE — ED Triage Notes (Signed)
 Pt c/o cough, loss of apatite, body aches, and fatigue since Saturday.
